# Patient Record
Sex: Male | Born: 1945
Health system: Southern US, Community
[De-identification: ages and names within clinical notes are randomized; demographics above are authoritative.]

---

## 2003-02-06 ENCOUNTER — Encounter: Payer: Self-pay | Admitting: Orthopaedic Surgery

## 2003-02-06 ENCOUNTER — Encounter: Admission: RE | Admit: 2003-02-06 | Discharge: 2003-02-06 | Payer: Self-pay | Admitting: Orthopaedic Surgery

## 2003-04-13 ENCOUNTER — Encounter: Payer: Self-pay | Admitting: Orthopaedic Surgery

## 2003-04-16 ENCOUNTER — Inpatient Hospital Stay (HOSPITAL_COMMUNITY): Admission: RE | Admit: 2003-04-16 | Discharge: 2003-04-19 | Payer: Self-pay | Admitting: Orthopaedic Surgery

## 2003-04-16 ENCOUNTER — Encounter: Payer: Self-pay | Admitting: Orthopaedic Surgery

## 2015-01-06 DIAGNOSIS — Z1389 Encounter for screening for other disorder: Secondary | ICD-10-CM | POA: Diagnosis not present

## 2015-01-06 DIAGNOSIS — J449 Chronic obstructive pulmonary disease, unspecified: Secondary | ICD-10-CM | POA: Diagnosis not present

## 2015-01-19 DIAGNOSIS — J449 Chronic obstructive pulmonary disease, unspecified: Secondary | ICD-10-CM | POA: Diagnosis not present

## 2015-01-19 DIAGNOSIS — F329 Major depressive disorder, single episode, unspecified: Secondary | ICD-10-CM | POA: Diagnosis not present

## 2015-01-19 DIAGNOSIS — J310 Chronic rhinitis: Secondary | ICD-10-CM | POA: Diagnosis not present

## 2015-01-28 DIAGNOSIS — J449 Chronic obstructive pulmonary disease, unspecified: Secondary | ICD-10-CM | POA: Diagnosis not present

## 2015-02-19 DIAGNOSIS — F4322 Adjustment disorder with anxiety: Secondary | ICD-10-CM | POA: Diagnosis not present

## 2015-02-19 DIAGNOSIS — Z9181 History of falling: Secondary | ICD-10-CM | POA: Diagnosis not present

## 2015-02-19 DIAGNOSIS — Z1389 Encounter for screening for other disorder: Secondary | ICD-10-CM | POA: Diagnosis not present

## 2015-05-20 DIAGNOSIS — M25471 Effusion, right ankle: Secondary | ICD-10-CM | POA: Diagnosis not present

## 2015-05-20 DIAGNOSIS — M109 Gout, unspecified: Secondary | ICD-10-CM | POA: Diagnosis not present

## 2015-05-20 DIAGNOSIS — Z6820 Body mass index (BMI) 20.0-20.9, adult: Secondary | ICD-10-CM | POA: Diagnosis not present

## 2015-05-20 DIAGNOSIS — M25579 Pain in unspecified ankle and joints of unspecified foot: Secondary | ICD-10-CM | POA: Diagnosis not present

## 2015-05-21 DIAGNOSIS — Z6820 Body mass index (BMI) 20.0-20.9, adult: Secondary | ICD-10-CM | POA: Diagnosis not present

## 2015-05-21 DIAGNOSIS — F419 Anxiety disorder, unspecified: Secondary | ICD-10-CM | POA: Diagnosis not present

## 2015-05-21 DIAGNOSIS — M1000 Idiopathic gout, unspecified site: Secondary | ICD-10-CM | POA: Diagnosis not present

## 2015-06-10 DIAGNOSIS — M25579 Pain in unspecified ankle and joints of unspecified foot: Secondary | ICD-10-CM | POA: Diagnosis not present

## 2015-06-10 DIAGNOSIS — Z6820 Body mass index (BMI) 20.0-20.9, adult: Secondary | ICD-10-CM | POA: Diagnosis not present

## 2016-09-19 DIAGNOSIS — J449 Chronic obstructive pulmonary disease, unspecified: Secondary | ICD-10-CM | POA: Diagnosis not present

## 2016-09-19 DIAGNOSIS — Z1389 Encounter for screening for other disorder: Secondary | ICD-10-CM | POA: Diagnosis not present

## 2016-09-19 DIAGNOSIS — G8929 Other chronic pain: Secondary | ICD-10-CM | POA: Diagnosis not present

## 2016-09-19 DIAGNOSIS — F419 Anxiety disorder, unspecified: Secondary | ICD-10-CM | POA: Diagnosis not present

## 2016-09-19 DIAGNOSIS — Z6821 Body mass index (BMI) 21.0-21.9, adult: Secondary | ICD-10-CM | POA: Diagnosis not present

## 2016-09-19 DIAGNOSIS — Z23 Encounter for immunization: Secondary | ICD-10-CM | POA: Diagnosis not present

## 2016-09-19 DIAGNOSIS — Z9181 History of falling: Secondary | ICD-10-CM | POA: Diagnosis not present

## 2016-12-21 DIAGNOSIS — F419 Anxiety disorder, unspecified: Secondary | ICD-10-CM | POA: Diagnosis not present

## 2016-12-21 DIAGNOSIS — G8929 Other chronic pain: Secondary | ICD-10-CM | POA: Diagnosis not present

## 2016-12-21 DIAGNOSIS — M109 Gout, unspecified: Secondary | ICD-10-CM | POA: Diagnosis not present

## 2016-12-21 DIAGNOSIS — Z6820 Body mass index (BMI) 20.0-20.9, adult: Secondary | ICD-10-CM | POA: Diagnosis not present

## 2017-02-08 DIAGNOSIS — J449 Chronic obstructive pulmonary disease, unspecified: Secondary | ICD-10-CM | POA: Diagnosis not present

## 2017-02-08 DIAGNOSIS — F419 Anxiety disorder, unspecified: Secondary | ICD-10-CM | POA: Diagnosis not present

## 2017-02-08 DIAGNOSIS — Z720 Tobacco use: Secondary | ICD-10-CM | POA: Diagnosis not present

## 2017-02-08 DIAGNOSIS — Z6829 Body mass index (BMI) 29.0-29.9, adult: Secondary | ICD-10-CM | POA: Diagnosis not present

## 2017-02-08 DIAGNOSIS — G8929 Other chronic pain: Secondary | ICD-10-CM | POA: Diagnosis not present

## 2017-02-19 DIAGNOSIS — F419 Anxiety disorder, unspecified: Secondary | ICD-10-CM | POA: Diagnosis not present

## 2017-02-19 DIAGNOSIS — J449 Chronic obstructive pulmonary disease, unspecified: Secondary | ICD-10-CM | POA: Diagnosis not present

## 2017-02-19 DIAGNOSIS — G8929 Other chronic pain: Secondary | ICD-10-CM | POA: Diagnosis not present

## 2017-02-19 DIAGNOSIS — Z6820 Body mass index (BMI) 20.0-20.9, adult: Secondary | ICD-10-CM | POA: Diagnosis not present

## 2017-02-19 DIAGNOSIS — Z720 Tobacco use: Secondary | ICD-10-CM | POA: Diagnosis not present

## 2017-05-21 DIAGNOSIS — Z139 Encounter for screening, unspecified: Secondary | ICD-10-CM | POA: Diagnosis not present

## 2017-05-21 DIAGNOSIS — J449 Chronic obstructive pulmonary disease, unspecified: Secondary | ICD-10-CM | POA: Diagnosis not present

## 2017-05-21 DIAGNOSIS — Z6820 Body mass index (BMI) 20.0-20.9, adult: Secondary | ICD-10-CM | POA: Diagnosis not present

## 2017-05-21 DIAGNOSIS — G8929 Other chronic pain: Secondary | ICD-10-CM | POA: Diagnosis not present

## 2017-05-21 DIAGNOSIS — M109 Gout, unspecified: Secondary | ICD-10-CM | POA: Diagnosis not present

## 2017-05-21 DIAGNOSIS — Z79899 Other long term (current) drug therapy: Secondary | ICD-10-CM | POA: Diagnosis not present

## 2017-05-21 DIAGNOSIS — F419 Anxiety disorder, unspecified: Secondary | ICD-10-CM | POA: Diagnosis not present

## 2017-05-21 DIAGNOSIS — Z9181 History of falling: Secondary | ICD-10-CM | POA: Diagnosis not present

## 2017-06-22 DIAGNOSIS — Z6820 Body mass index (BMI) 20.0-20.9, adult: Secondary | ICD-10-CM | POA: Diagnosis not present

## 2017-06-22 DIAGNOSIS — G8929 Other chronic pain: Secondary | ICD-10-CM | POA: Diagnosis not present

## 2017-06-22 DIAGNOSIS — F419 Anxiety disorder, unspecified: Secondary | ICD-10-CM | POA: Diagnosis not present

## 2017-08-22 DIAGNOSIS — Z6820 Body mass index (BMI) 20.0-20.9, adult: Secondary | ICD-10-CM | POA: Diagnosis not present

## 2017-08-22 DIAGNOSIS — H5711 Ocular pain, right eye: Secondary | ICD-10-CM | POA: Diagnosis not present

## 2017-08-22 DIAGNOSIS — S0101XA Laceration without foreign body of scalp, initial encounter: Secondary | ICD-10-CM | POA: Diagnosis not present

## 2017-08-22 DIAGNOSIS — Z23 Encounter for immunization: Secondary | ICD-10-CM | POA: Diagnosis not present

## 2017-10-22 DIAGNOSIS — G8929 Other chronic pain: Secondary | ICD-10-CM | POA: Diagnosis not present

## 2017-10-22 DIAGNOSIS — Z6821 Body mass index (BMI) 21.0-21.9, adult: Secondary | ICD-10-CM | POA: Diagnosis not present

## 2017-10-22 DIAGNOSIS — F419 Anxiety disorder, unspecified: Secondary | ICD-10-CM | POA: Diagnosis not present

## 2017-10-22 DIAGNOSIS — Z1211 Encounter for screening for malignant neoplasm of colon: Secondary | ICD-10-CM | POA: Diagnosis not present

## 2017-10-22 DIAGNOSIS — J449 Chronic obstructive pulmonary disease, unspecified: Secondary | ICD-10-CM | POA: Diagnosis not present

## 2017-10-31 DIAGNOSIS — Z1212 Encounter for screening for malignant neoplasm of rectum: Secondary | ICD-10-CM | POA: Diagnosis not present

## 2017-10-31 DIAGNOSIS — Z1211 Encounter for screening for malignant neoplasm of colon: Secondary | ICD-10-CM | POA: Diagnosis not present

## 2018-01-22 DIAGNOSIS — Z1389 Encounter for screening for other disorder: Secondary | ICD-10-CM | POA: Diagnosis not present

## 2018-01-22 DIAGNOSIS — Z6821 Body mass index (BMI) 21.0-21.9, adult: Secondary | ICD-10-CM | POA: Diagnosis not present

## 2018-01-22 DIAGNOSIS — G629 Polyneuropathy, unspecified: Secondary | ICD-10-CM | POA: Diagnosis not present

## 2018-01-22 DIAGNOSIS — F419 Anxiety disorder, unspecified: Secondary | ICD-10-CM | POA: Diagnosis not present

## 2018-01-22 DIAGNOSIS — Z79899 Other long term (current) drug therapy: Secondary | ICD-10-CM | POA: Diagnosis not present

## 2018-01-22 DIAGNOSIS — Z1331 Encounter for screening for depression: Secondary | ICD-10-CM | POA: Diagnosis not present

## 2018-01-22 DIAGNOSIS — M109 Gout, unspecified: Secondary | ICD-10-CM | POA: Diagnosis not present

## 2018-01-22 DIAGNOSIS — J449 Chronic obstructive pulmonary disease, unspecified: Secondary | ICD-10-CM | POA: Diagnosis not present

## 2018-02-04 DIAGNOSIS — G5603 Carpal tunnel syndrome, bilateral upper limbs: Secondary | ICD-10-CM | POA: Diagnosis not present

## 2018-02-20 DIAGNOSIS — G629 Polyneuropathy, unspecified: Secondary | ICD-10-CM | POA: Diagnosis not present

## 2018-02-21 DIAGNOSIS — G5603 Carpal tunnel syndrome, bilateral upper limbs: Secondary | ICD-10-CM | POA: Diagnosis not present

## 2018-02-27 DIAGNOSIS — Z6820 Body mass index (BMI) 20.0-20.9, adult: Secondary | ICD-10-CM | POA: Diagnosis not present

## 2018-02-27 DIAGNOSIS — R0602 Shortness of breath: Secondary | ICD-10-CM | POA: Diagnosis not present

## 2018-02-27 DIAGNOSIS — J449 Chronic obstructive pulmonary disease, unspecified: Secondary | ICD-10-CM | POA: Diagnosis not present

## 2018-02-27 DIAGNOSIS — G5600 Carpal tunnel syndrome, unspecified upper limb: Secondary | ICD-10-CM | POA: Diagnosis not present

## 2018-03-05 DIAGNOSIS — J449 Chronic obstructive pulmonary disease, unspecified: Secondary | ICD-10-CM | POA: Diagnosis not present

## 2018-03-05 DIAGNOSIS — G5602 Carpal tunnel syndrome, left upper limb: Secondary | ICD-10-CM | POA: Diagnosis not present

## 2018-03-05 DIAGNOSIS — F17210 Nicotine dependence, cigarettes, uncomplicated: Secondary | ICD-10-CM | POA: Diagnosis not present

## 2018-03-05 DIAGNOSIS — F17200 Nicotine dependence, unspecified, uncomplicated: Secondary | ICD-10-CM | POA: Diagnosis not present

## 2018-03-05 DIAGNOSIS — Z791 Long term (current) use of non-steroidal anti-inflammatories (NSAID): Secondary | ICD-10-CM | POA: Diagnosis not present

## 2018-03-05 DIAGNOSIS — Z79899 Other long term (current) drug therapy: Secondary | ICD-10-CM | POA: Diagnosis not present

## 2018-03-05 DIAGNOSIS — F418 Other specified anxiety disorders: Secondary | ICD-10-CM | POA: Diagnosis not present

## 2018-03-05 DIAGNOSIS — M1990 Unspecified osteoarthritis, unspecified site: Secondary | ICD-10-CM | POA: Diagnosis not present

## 2018-03-08 DIAGNOSIS — Z4789 Encounter for other orthopedic aftercare: Secondary | ICD-10-CM | POA: Diagnosis not present

## 2018-03-08 DIAGNOSIS — M25532 Pain in left wrist: Secondary | ICD-10-CM | POA: Diagnosis not present

## 2018-03-29 DIAGNOSIS — G5601 Carpal tunnel syndrome, right upper limb: Secondary | ICD-10-CM | POA: Diagnosis not present

## 2018-03-29 DIAGNOSIS — M79641 Pain in right hand: Secondary | ICD-10-CM | POA: Diagnosis not present

## 2018-04-05 DIAGNOSIS — F419 Anxiety disorder, unspecified: Secondary | ICD-10-CM | POA: Diagnosis not present

## 2018-04-05 DIAGNOSIS — L03114 Cellulitis of left upper limb: Secondary | ICD-10-CM | POA: Diagnosis not present

## 2018-04-05 DIAGNOSIS — Z48811 Encounter for surgical aftercare following surgery on the nervous system: Secondary | ICD-10-CM | POA: Diagnosis not present

## 2018-04-05 DIAGNOSIS — M1991 Primary osteoarthritis, unspecified site: Secondary | ICD-10-CM | POA: Diagnosis not present

## 2018-04-05 DIAGNOSIS — F329 Major depressive disorder, single episode, unspecified: Secondary | ICD-10-CM | POA: Diagnosis not present

## 2018-04-05 DIAGNOSIS — G5603 Carpal tunnel syndrome, bilateral upper limbs: Secondary | ICD-10-CM | POA: Diagnosis not present

## 2018-04-05 DIAGNOSIS — Z602 Problems related to living alone: Secondary | ICD-10-CM | POA: Diagnosis not present

## 2018-04-05 DIAGNOSIS — M109 Gout, unspecified: Secondary | ICD-10-CM | POA: Diagnosis not present

## 2018-04-05 DIAGNOSIS — Z9181 History of falling: Secondary | ICD-10-CM | POA: Diagnosis not present

## 2018-04-09 DIAGNOSIS — L03114 Cellulitis of left upper limb: Secondary | ICD-10-CM | POA: Diagnosis not present

## 2018-04-09 DIAGNOSIS — F419 Anxiety disorder, unspecified: Secondary | ICD-10-CM | POA: Diagnosis not present

## 2018-04-09 DIAGNOSIS — G5603 Carpal tunnel syndrome, bilateral upper limbs: Secondary | ICD-10-CM | POA: Diagnosis not present

## 2018-04-09 DIAGNOSIS — M79609 Pain in unspecified limb: Secondary | ICD-10-CM | POA: Diagnosis not present

## 2018-04-09 DIAGNOSIS — Z79899 Other long term (current) drug therapy: Secondary | ICD-10-CM | POA: Diagnosis not present

## 2018-04-09 DIAGNOSIS — Z789 Other specified health status: Secondary | ICD-10-CM | POA: Diagnosis not present

## 2018-04-09 DIAGNOSIS — F418 Other specified anxiety disorders: Secondary | ICD-10-CM | POA: Diagnosis not present

## 2018-04-09 DIAGNOSIS — F1010 Alcohol abuse, uncomplicated: Secondary | ICD-10-CM | POA: Diagnosis not present

## 2018-04-09 DIAGNOSIS — Z9181 History of falling: Secondary | ICD-10-CM | POA: Diagnosis not present

## 2018-04-09 DIAGNOSIS — R296 Repeated falls: Secondary | ICD-10-CM | POA: Diagnosis not present

## 2018-04-09 DIAGNOSIS — M109 Gout, unspecified: Secondary | ICD-10-CM | POA: Diagnosis not present

## 2018-04-09 DIAGNOSIS — F329 Major depressive disorder, single episode, unspecified: Secondary | ICD-10-CM | POA: Diagnosis not present

## 2018-04-09 DIAGNOSIS — M1990 Unspecified osteoarthritis, unspecified site: Secondary | ICD-10-CM | POA: Diagnosis not present

## 2018-04-09 DIAGNOSIS — Z602 Problems related to living alone: Secondary | ICD-10-CM | POA: Diagnosis not present

## 2018-04-09 DIAGNOSIS — G312 Degeneration of nervous system due to alcohol: Secondary | ICD-10-CM | POA: Diagnosis not present

## 2018-04-09 DIAGNOSIS — J449 Chronic obstructive pulmonary disease, unspecified: Secondary | ICD-10-CM | POA: Diagnosis not present

## 2018-04-09 DIAGNOSIS — R42 Dizziness and giddiness: Secondary | ICD-10-CM | POA: Diagnosis not present

## 2018-04-09 DIAGNOSIS — Z48811 Encounter for surgical aftercare following surgery on the nervous system: Secondary | ICD-10-CM | POA: Diagnosis not present

## 2018-04-09 DIAGNOSIS — E871 Hypo-osmolality and hyponatremia: Secondary | ICD-10-CM | POA: Diagnosis not present

## 2018-04-09 DIAGNOSIS — G959 Disease of spinal cord, unspecified: Secondary | ICD-10-CM | POA: Diagnosis not present

## 2018-04-09 DIAGNOSIS — R531 Weakness: Secondary | ICD-10-CM | POA: Diagnosis not present

## 2018-04-09 DIAGNOSIS — M1991 Primary osteoarthritis, unspecified site: Secondary | ICD-10-CM | POA: Diagnosis not present

## 2018-04-09 DIAGNOSIS — R270 Ataxia, unspecified: Secondary | ICD-10-CM | POA: Diagnosis not present

## 2018-04-10 ENCOUNTER — Inpatient Hospital Stay (HOSPITAL_COMMUNITY)
Admission: AD | Admit: 2018-04-10 | Discharge: 2018-04-15 | DRG: 551 | Disposition: A | Discharge status: Home Health | Payer: Medicare HMO | Source: Other Acute Inpatient Hospital | Attending: Internal Medicine | Admitting: Internal Medicine

## 2018-04-10 DIAGNOSIS — D649 Anemia, unspecified: Secondary | ICD-10-CM | POA: Diagnosis present

## 2018-04-10 DIAGNOSIS — Q7649 Other congenital malformations of spine, not associated with scoliosis: Secondary | ICD-10-CM | POA: Diagnosis not present

## 2018-04-10 DIAGNOSIS — F418 Other specified anxiety disorders: Secondary | ICD-10-CM | POA: Diagnosis not present

## 2018-04-10 DIAGNOSIS — J449 Chronic obstructive pulmonary disease, unspecified: Secondary | ICD-10-CM | POA: Diagnosis not present

## 2018-04-10 DIAGNOSIS — Z79899 Other long term (current) drug therapy: Secondary | ICD-10-CM | POA: Diagnosis not present

## 2018-04-10 DIAGNOSIS — G259 Extrapyramidal and movement disorder, unspecified: Secondary | ICD-10-CM | POA: Diagnosis not present

## 2018-04-10 DIAGNOSIS — G9519 Other vascular myelopathies: Secondary | ICD-10-CM | POA: Diagnosis not present

## 2018-04-10 DIAGNOSIS — F17200 Nicotine dependence, unspecified, uncomplicated: Secondary | ICD-10-CM | POA: Diagnosis present

## 2018-04-10 DIAGNOSIS — R531 Weakness: Secondary | ICD-10-CM

## 2018-04-10 DIAGNOSIS — E860 Dehydration: Secondary | ICD-10-CM | POA: Diagnosis not present

## 2018-04-10 DIAGNOSIS — E871 Hypo-osmolality and hyponatremia: Secondary | ICD-10-CM | POA: Diagnosis present

## 2018-04-10 DIAGNOSIS — M50222 Other cervical disc displacement at C5-C6 level: Secondary | ICD-10-CM | POA: Diagnosis not present

## 2018-04-10 DIAGNOSIS — F419 Anxiety disorder, unspecified: Secondary | ICD-10-CM | POA: Diagnosis not present

## 2018-04-10 DIAGNOSIS — G959 Disease of spinal cord, unspecified: Secondary | ICD-10-CM | POA: Diagnosis not present

## 2018-04-10 DIAGNOSIS — R270 Ataxia, unspecified: Secondary | ICD-10-CM | POA: Diagnosis not present

## 2018-04-10 DIAGNOSIS — M4802 Spinal stenosis, cervical region: Principal | ICD-10-CM | POA: Diagnosis present

## 2018-04-10 DIAGNOSIS — Z789 Other specified health status: Secondary | ICD-10-CM | POA: Diagnosis not present

## 2018-04-10 DIAGNOSIS — F1010 Alcohol abuse, uncomplicated: Secondary | ICD-10-CM | POA: Diagnosis not present

## 2018-04-10 DIAGNOSIS — M1990 Unspecified osteoarthritis, unspecified site: Secondary | ICD-10-CM | POA: Diagnosis not present

## 2018-04-10 DIAGNOSIS — Q069 Congenital malformation of spinal cord, unspecified: Secondary | ICD-10-CM

## 2018-04-10 LAB — CBC WITH DIFFERENTIAL/PLATELET
ABS IMMATURE GRANULOCYTES: 0 10*3/uL (ref 0.0–0.1)
BASOS PCT: 1 %
Basophils Absolute: 0.1 10*3/uL (ref 0.0–0.1)
EOS PCT: 3 %
Eosinophils Absolute: 0.2 10*3/uL (ref 0.0–0.7)
HCT: 36.8 % — ABNORMAL LOW (ref 39.0–52.0)
HEMOGLOBIN: 12.4 g/dL — AB (ref 13.0–17.0)
Immature Granulocytes: 0 %
Lymphocytes Relative: 40 %
Lymphs Abs: 2 10*3/uL (ref 0.7–4.0)
MCH: 32.9 pg (ref 26.0–34.0)
MCHC: 33.7 g/dL (ref 30.0–36.0)
MCV: 97.6 fL (ref 78.0–100.0)
MONO ABS: 0.7 10*3/uL (ref 0.1–1.0)
MONOS PCT: 15 %
NEUTROS ABS: 2.1 10*3/uL (ref 1.7–7.7)
Neutrophils Relative %: 41 %
PLATELETS: 345 10*3/uL (ref 150–400)
RBC: 3.77 MIL/uL — ABNORMAL LOW (ref 4.22–5.81)
RDW: 12 % (ref 11.5–15.5)
WBC: 5.1 10*3/uL (ref 4.0–10.5)

## 2018-04-10 LAB — COMPREHENSIVE METABOLIC PANEL
ALBUMIN: 3.5 g/dL (ref 3.5–5.0)
ALT: 16 U/L — AB (ref 17–63)
AST: 24 U/L (ref 15–41)
Alkaline Phosphatase: 55 U/L (ref 38–126)
Anion gap: 8 (ref 5–15)
BILIRUBIN TOTAL: 0.5 mg/dL (ref 0.3–1.2)
BUN: 8 mg/dL (ref 6–20)
CO2: 21 mmol/L — ABNORMAL LOW (ref 22–32)
Calcium: 8.8 mg/dL — ABNORMAL LOW (ref 8.9–10.3)
Chloride: 103 mmol/L (ref 101–111)
Creatinine, Ser: 0.55 mg/dL — ABNORMAL LOW (ref 0.61–1.24)
GFR calc Af Amer: >60 mL/min (ref >60)
GLUCOSE: 94 mg/dL (ref 65–99)
POTASSIUM: 4.1 mmol/L (ref 3.5–5.1)
Sodium: 132 mmol/L — ABNORMAL LOW (ref 135–145)
TOTAL PROTEIN: 6.5 g/dL (ref 6.5–8.1)

## 2018-04-10 LAB — MAGNESIUM: Magnesium: 1.7 mg/dL (ref 1.7–2.4)

## 2018-04-10 MED ORDER — SODIUM CHLORIDE 0.9% FLUSH
3.0000 mL | INTRAVENOUS | Status: DC | PRN
Start: 2018-04-10 — End: 2018-04-15

## 2018-04-10 MED ORDER — VITAMIN B-1 100 MG PO TABS
100.0000 mg | ORAL_TABLET | Freq: Every day | ORAL | Status: DC
  Administered 2018-04-11 – 2018-04-15 (×5): 100 mg via ORAL
  Filled 2018-04-10 (×6): qty 1

## 2018-04-10 MED ORDER — LORAZEPAM 1 MG PO TABS
1.0000 mg | ORAL_TABLET | Freq: Four times a day (QID) | ORAL | Status: AC | PRN
  Administered 2018-04-12: 1 mg via ORAL
  Filled 2018-04-10: qty 1

## 2018-04-10 MED ORDER — SODIUM CHLORIDE 0.9 % IV SOLN: 250.0000 mL | INTRAVENOUS | Status: DC | PRN

## 2018-04-10 MED ORDER — FOLIC ACID 1 MG PO TABS
1.0000 mg | ORAL_TABLET | Freq: Every day | ORAL | Status: DC
  Administered 2018-04-10 – 2018-04-15 (×6): 1 mg via ORAL
  Filled 2018-04-10 (×7): qty 1

## 2018-04-10 MED ORDER — SODIUM CHLORIDE 0.9% FLUSH
3.0000 mL | Freq: Two times a day (BID) | INTRAVENOUS | Status: DC
  Administered 2018-04-10 – 2018-04-14 (×7): 3 mL via INTRAVENOUS

## 2018-04-10 MED ORDER — LORAZEPAM 2 MG/ML IJ SOLN
1.0000 mg | Freq: Four times a day (QID) | INTRAMUSCULAR | Status: AC | PRN
  Administered 2018-04-11 (×2): 1 mg via INTRAVENOUS
  Filled 2018-04-10 (×2): qty 1

## 2018-04-10 MED ORDER — ADULT MULTIVITAMIN W/MINERALS CH
1.0000 | ORAL_TABLET | Freq: Every day | ORAL | Status: DC
  Administered 2018-04-10 – 2018-04-15 (×6): 1 via ORAL
  Filled 2018-04-10 (×6): qty 1

## 2018-04-10 MED ORDER — THIAMINE HCL 100 MG/ML IJ SOLN
100.0000 mg | Freq: Every day | INTRAMUSCULAR | Status: DC
  Administered 2018-04-10: 100 mg via INTRAVENOUS
  Filled 2018-04-10: qty 2

## 2018-04-10 NOTE — Consult Note (Signed)
Reason for Consult:cervical stenosis, cervical myelopathy Referring Physician: Onalee Huadavid, r a  Roger RilingJames Sanders is an 72 y.o. male.  HPI: whom was [facility] for severe gait difficulties. He reportedly had carpal tunnel release surgery ~two weeks ago. He has had weakness in the left upper extremity since that time. He states when he tried to stand two days ago all he did was shake. He admits to drinking heavily since 10/2016 when his wife passed, and does drink daily. MRI revealed spinal stenosis at C 3/4. There appears to be calcification of the ligamentum flavum at this level causing the stenosis. He also admits to falling and striking his head approximately two weeks ago. He states however that his problems walking were not present until Sunday. He denies pain, nor change in his bowel and bladder function.   No past medical history on file.   No family history on file.  Social History:  has no tobacco, alcohol, and drug history on file.  Allergies: No Known Allergies  Medications: I have reviewed the patient's current medications.  No results found for this or any previous visit (from the past 48 hour(s)).  No results found.  Review of Systems  Genitourinary: Negative.   Neurological: Positive for tremors and focal weakness.       DIFFICULTY WALKING  Endo/Heme/Allergies: Negative.    Blood pressure 121/63, pulse 76, temperature 98.2 F (36.8 C), temperature source Oral, resp. rate 18, SpO2 98 %. Physical Exam  Constitutional: He is oriented to person, place, and time.  HENT:  Head: Normocephalic and atraumatic.  Eyes: Pupils are equal, round, and reactive to light. Conjunctivae and EOM are normal.  Neck: Normal range of motion.  Neurological: He is alert and oriented to person, place, and time. He has normal reflexes. He displays tremor. No cranial nerve deficit. He exhibits normal muscle tone. Coordination abnormal.  Has an ulnar claw hand on the left Weak right and  left grips 4/5 Right upper extremity otherwise strong.  Weakness in left deltoid, biceps, triceps, intrinsics 4/5 Movements are saccadic in his extremities, not smooth at all Proprioception intact Could not assess gait  Skin: Skin is warm and dry.  Psychiatric:  depressed    Assessment/Plan: Roger RilingJames Sanders is a 72 y.o. male Whom presents with Lue weakness, ballistic type movements, etoh abuse, and cervical stenosis.  Would like a better study since other mri has too much movement Not clear why acute onset of gait problems, calcified ligament would not cause this.  I have placed him on a withdrawal protocol.  Will follow.   Yesmin Mutch L 04/10/2018, 7:42 PM

## 2018-04-10 NOTE — H&P (Signed)
History and Physical    Roger Sanders ZOX:[id number] DOB: 08/20/1946 DOA: 04/10/2018  PCP: Default, Provider, MD  Patient [facility]  Chief Complaint: Generalized weakness  HPI: Roger Sanders is a 72 y.o. male with medical history significant of recent carpal tunnel surgery, previous back surgery transferred [facility] for neurosurgical evaluation.  Patient presented there with generalized weakness without any focal neurological deficits and had a stroke work-up.  He had a MRI of his C spine which showed C3-C4 possible  mass-effect on cord and swelling.  Patient denies any neck pain.  He denies any fevers.  He did have a recent fall but denies any real neck injury.  He denies any upper extremity weakness.  He denies any new urinary symptoms or any saddle anesthesia.  He does have some pain around the incision from his previous carpal tunnel surgery but no other neurological deficits.  Dr. Franky Machoabbell with neurosurgery was called who recommended patient being transferred here for neurosurgical evaluation.  Review of Systems: As per HPI otherwise 10 point review of systems negative.   History reviewed. No pertinent past medical history.  Carpal tunnel surgery, previous lumbar back surgery  History reviewed. No pertinent surgical history.   reports that he has been smoking.  He has never used smokeless tobacco. He reports that he drinks alcohol. His drug history is not on file.  No Known Allergies  History reviewed. No pertinent family history.  No premature coronary artery disease  Prior to Admission medications   Not on File  Reviewed and pending  Physical Exam: Vitals:   04/10/18 1700 04/10/18 2058 04/10/18 2349 04/11/18 0431  BP: 121/63 (!) 119/44 128/61 115/68  Pulse: 76 76 68 72  Resp: 18 18 15 14   Temp: 98.2 F (36.8 C) 98.2 F (36.8 C) 98.2 F (36.8 C) 98.2 F (36.8 C)  TempSrc: Oral Oral Oral Oral  SpO2: 98% 98% 97% 99%      Constitutional:  NAD, calm, comfortable Vitals:   04/10/18 1700 04/10/18 2058 04/10/18 2349 04/11/18 0431  BP: 121/63 (!) 119/44 128/61 115/68  Pulse: 76 76 68 72  Resp: 18 18 15 14   Temp: 98.2 F (36.8 C) 98.2 F (36.8 C) 98.2 F (36.8 C) 98.2 F (36.8 C)  TempSrc: Oral Oral Oral Oral  SpO2: 98% 98% 97% 99%   Eyes: PERRL, lids and conjunctivae normal ENMT: Mucous membranes are moist. Posterior pharynx clear of any exudate or lesions.Normal dentition.  Neck: normal, supple, no masses, no thyromegaly Respiratory: clear to auscultation bilaterally, no wheezing, no crackles. Normal respiratory effort. No accessory muscle use.  Cardiovascular: Regular rate and rhythm, no murmurs / rubs / gallops. No extremity edema. 2+ pedal pulses. No carotid bruits.  Abdomen: no tenderness, no masses palpated. No hepatosplenomegaly. Bowel sounds positive.  Musculoskeletal: no clubbing / cyanosis. No joint deformity upper and lower extremities. Good ROM, no contractures. Normal muscle tone.  Skin: no rashes, lesions, ulcers. No induration Neurologic: CN 2-12 grossly intact. Sensation intact, DTR normal. Strength 5/5 in all 4.  Psychiatric: Normal judgment and insight. Alert and oriented x 3. Normal mood.    Labs on Admission: I have personally reviewed following labs and imaging studies  CBC: Recent Labs  Lab 04/10/18 1919  WBC 5.1  NEUTROABS 2.1  HGB 12.4*  HCT 36.8*  MCV 97.6  PLT 345   Basic Metabolic Panel: Recent Labs  Lab 04/10/18 1919  NA 132*  K 4.1  CL 103  CO2 21*  GLUCOSE 94  BUN 8  CREATININE 0.55*  CALCIUM 8.8*  MG 1.7   GFR: CrCl cannot be calculated (Unknown ideal weight.). Liver Function Tests: Recent Labs  Lab 04/10/18 1919  AST 24  ALT 16*  ALKPHOS 55  BILITOT 0.5  PROT 6.5  ALBUMIN 3.5   No results for input(s): LIPASE, AMYLASE in the last 168 hours. No results for input(s): AMMONIA in the last 168 hours. Coagulation Profile: No results for input(s): INR, PROTIME in  the last 168 hours. Cardiac Enzymes: No results for input(s): CKTOTAL, CKMB, CKMBINDEX, TROPONINI in the last 168 hours. BNP (last 3 results) No results for input(s): PROBNP in the last 8760 hours. HbA1C: No results for input(s): HGBA1C in the last 72 hours. CBG: No results for input(s): GLUCAP in the last 168 hours. Lipid Profile: No results for input(s): CHOL, HDL, LDLCALC, TRIG, CHOLHDL, LDLDIRECT in the last 72 hours. Thyroid Function Tests: No results for input(s): TSH, T4TOTAL, FREET4, T3FREE, THYROIDAB in the last 72 hours. Anemia Panel: No results for input(s): VITAMINB12, FOLATE, FERRITIN, TIBC, IRON, RETICCTPCT in the last 72 hours. Urine analysis: No results found for: COLORURINE, APPEARANCEUR, LABSPEC, PHURINE, GLUCOSEU, HGBUR, BILIRUBINUR, KETONESUR, PROTEINUR, UROBILINOGEN, NITRITE, LEUKOCYTESUR Sepsis Labs: !!!!!!!!!!!!!!!!!!!!!!!!!!!!!!!!!!!!!!!!!!!! @LABRCNTIP (procalcitonin:4,lacticidven:4) )No results found for this or any previous visit (from the past 240 hour(s)).   Radiological Exams on Admission: No results found.   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  No neck pain.  Neurosurgical evaluation pending Dr. Franky Macho.  Active Problems:   Generalized weakness-physical therapy evaluation         DVT prophylaxis: SCDs Code Status: Full Family Communication: None Disposition Plan: Per day team Consults called: Neurosurgery Admission status: Observation   Raynold Blankenbaker A MD [facility]  If 7PM-7AM, please contact night-coverage [email or web address] Password [facility]  04/11/2018, 6:43 AM

## 2018-04-11 ENCOUNTER — Observation Stay (HOSPITAL_COMMUNITY): Payer: Medicare HMO

## 2018-04-11 ENCOUNTER — Encounter (HOSPITAL_COMMUNITY): Payer: Self-pay | Admitting: General Practice

## 2018-04-11 ENCOUNTER — Other Ambulatory Visit: Payer: Self-pay

## 2018-04-11 DIAGNOSIS — D649 Anemia, unspecified: Secondary | ICD-10-CM | POA: Diagnosis present

## 2018-04-11 DIAGNOSIS — G9519 Other vascular myelopathies: Secondary | ICD-10-CM | POA: Diagnosis present

## 2018-04-11 DIAGNOSIS — M4802 Spinal stenosis, cervical region: Secondary | ICD-10-CM | POA: Diagnosis present

## 2018-04-11 DIAGNOSIS — Q7649 Other congenital malformations of spine, not associated with scoliosis: Secondary | ICD-10-CM | POA: Diagnosis not present

## 2018-04-11 DIAGNOSIS — G259 Extrapyramidal and movement disorder, unspecified: Secondary | ICD-10-CM | POA: Diagnosis not present

## 2018-04-11 DIAGNOSIS — F17200 Nicotine dependence, unspecified, uncomplicated: Secondary | ICD-10-CM | POA: Diagnosis present

## 2018-04-11 DIAGNOSIS — E860 Dehydration: Secondary | ICD-10-CM | POA: Diagnosis present

## 2018-04-11 DIAGNOSIS — F419 Anxiety disorder, unspecified: Secondary | ICD-10-CM | POA: Diagnosis present

## 2018-04-11 DIAGNOSIS — E871 Hypo-osmolality and hyponatremia: Secondary | ICD-10-CM | POA: Diagnosis present

## 2018-04-11 DIAGNOSIS — R531 Weakness: Secondary | ICD-10-CM | POA: Diagnosis present

## 2018-04-11 LAB — BASIC METABOLIC PANEL
Anion gap: 8 (ref 5–15)
BUN: 7 mg/dL (ref 6–20)
CALCIUM: 9 mg/dL (ref 8.9–10.3)
CHLORIDE: 99 mmol/L — AB (ref 101–111)
CO2: 23 mmol/L (ref 22–32)
CREATININE: 0.56 mg/dL — AB (ref 0.61–1.24)
GFR calc non Af Amer: >60 mL/min (ref >60)
GLUCOSE: 94 mg/dL (ref 65–99)
Potassium: 4.3 mmol/L (ref 3.5–5.1)
Sodium: 130 mmol/L — ABNORMAL LOW (ref 135–145)

## 2018-04-11 NOTE — Progress Notes (Signed)
Physical Therapy Evaluation Patient Details Name: Roger Sanders MRN: [id number] DOB: August 04, 1946 Today's Date: 04/11/2018   History of Present Illness  Patient is a 72 y/o male admitted on 04/10/18 for neurosurgical evaluation due to general weakness, tremors, and gait difficulties. MRI revealing spinal stenosis at C3-4. Patient undergoing continued work-up. PMH significant for EOTH abuse, recent carpal tunnel surgery, prior back surgeries.   Clinical Impression  Patient admitted with the above listed diagnosis. Prior to admission patient reports ability to perform functional mobility and ADLs with/without SPC. Today able to perform bed mobility with Min guard and transfers at Min A for overall stability and balance. Does demonstrate tremor upon initial sitting and standing positions, however nearly resolves. Able to side step at EOB and march in place with no LOB, however close guarding for safety. PT to continue to follow acutely to maximize functional mobility and safety.     Follow Up Recommendations Supervision for mobility/OOB(will continue to assess depending on hospital course)    Equipment Recommendations  Rolling walker with 5" wheels    Recommendations for Other Services OT consult     Precautions / Restrictions Precautions Precautions: Fall Restrictions Weight Bearing Restrictions: No      Mobility  Bed Mobility Overal bed mobility: Needs Assistance Bed Mobility: Supine to Sit;Sit to Supine     Supine to sit: Min guard Sit to supine: Min guard   General bed mobility comments: intermittent VC for sequencing for overall efficiency of task  Transfers Overall transfer level: Needs assistance Equipment used: Rolling walker (2 wheeled) Transfers: Sit to/from Stand Sit to Stand: Min assist;Min guard         General transfer comment: patient very shaking during transfer, hesitant to transfer due to fear of falling  Ambulation/Gait Ambulation/Gait assistance: Min  assist;Min guard   Assistive device: Rolling walker (2 wheeled)       General Gait Details: side stepping R and L at EOB [employer] for steadying; may require increased external support for forward ambulation  Stairs            Wheelchair Mobility    Modified Rankin (Stroke Patients Only) Modified Rankin (Stroke Patients Only) Pre-Morbid Rankin Score: No significant disability Modified Rankin: Moderately severe disability     Balance Overall balance assessment: Needs assistance Sitting-balance support: Single extremity supported;Feet supported Sitting balance-Leahy Scale: Fair     Standing balance support: Bilateral upper extremity supported Standing balance-Leahy Scale: Poor Standing balance comment: reliant on RW + close guarding for stability and safety                             Pertinent Vitals/Pain Pain Assessment: No/denies pain    Home Living Family/patient expects to be discharged to:: Private residence Living Arrangements: Alone   Type of Home: House Home Access: Ramped entrance     Home Layout: One level Home Equipment: Cane - single point Additional Comments: reports ability to perform ADLs and light housework prior to admission    Prior Function Level of Independence: Independent;Independent with assistive device(s)         Comments: reports intermittent sue of SPC     Hand Dominance        Extremity/Trunk Assessment   Upper Extremity Assessment Upper Extremity Assessment: Defer to OT evaluation    Lower Extremity Assessment Lower Extremity Assessment: Generalized weakness       Communication   Communication: No difficulties  Cognition Arousal/Alertness: Awake/alert Behavior  During Therapy: WFL for tasks assessed/performed Overall Cognitive Status: No family/caregiver present to determine baseline cognitive functioning                                 General Comments: often times would stare at  computer while sitting EOB requiring redirection to attend to tasks      General Comments      Exercises     Assessment/Plan    PT Assessment Patient needs continued PT services  PT Problem List Decreased strength;Decreased activity tolerance;Decreased balance;Decreased mobility;Decreased coordination;Decreased knowledge of use of DME;Decreased safety awareness       PT Treatment Interventions DME instruction;Gait training;Functional mobility training;Therapeutic activities;Therapeutic exercise;Balance training;Patient/family education    PT Goals (Current goals can be found in the Care Plan section)  Acute Rehab PT Goals Patient Stated Goal: get back to normal PT Goal Formulation: With patient Time For Goal Achievement: 04/25/18 Potential to Achieve Goals: Good    Frequency Min 4X/week   Barriers to discharge        Co-evaluation               AM-PAC PT "6 Clicks" Daily Activity  Outcome Measure Difficulty turning over in bed (including adjusting bedclothes, sheets and blankets)?: A Little Difficulty moving from lying on back to sitting on the side of the bed? : Unable Difficulty sitting down on and standing up from a chair with arms (e.g., wheelchair, bedside commode, etc,.)?: Unable Help needed moving to and from a bed to chair (including a wheelchair)?: A Little Help needed walking in hospital room?: A Little Help needed climbing 3-5 steps with a railing? : A Lot 6 Click Score: 13    End of Session Equipment Utilized During Treatment: Gait belt Activity Tolerance: Patient tolerated treatment well Patient left: in bed;with call bell/phone within reach;with bed alarm set Nurse Communication: Mobility status PT Visit Diagnosis: Unsteadiness on feet (R26.81);Other abnormalities of gait and mobility (R26.89);Difficulty in walking, not elsewhere classified (R26.2);Muscle weakness (generalized) (M62.81)    Time: [id number] PT Time Calculation (min) (ACUTE ONLY):  21 min   Charges:   PT Evaluation $PT Eval Moderate Complexity: 1 Mod     PT G Codes:         Kipp LaurenceStephanie R Aaron, PT, DPT 04/11/18 2:27 PM

## 2018-04-11 NOTE — Progress Notes (Signed)
PROGRESS NOTE    Roger Sanders  ZOX:[id number] DOB: 1946/05/24 DOA: 04/10/2018 PCP: Default, Provider, MD    Brief Narrative:  Roger Sanders is a 72 y.o. male with medical history significant of recent carpal tunnel surgery, previous back surgery transferred here from [facility] for neurosurgical evaluation.  Patient presented there with generalized weakness without any focal neurological deficits and had a stroke work-up.  He had a MRI of his C spine which showed C3-C4 possible  mass-effect on cord and swelling.  Dr. Franky Macho with neurosurgery was called who recommended patient being transferred here for neurosurgical evaluation. A repeat MRi of the cervical spine ordered.       Assessment & Plan:   Principal Problem:   Spine anomaly Active Problems:   Generalized weakness   Spinal cord anomaly (HCC)   Severe spinal canal stenosis at C3 AND C4: Further management asper neuro surgery .  Pain control and therapy eval for now.    Mild normocytic anemia:  Hemoglobin stable around 12.    Hyponatremia:  Unclear etiology.  Suspect some dehydration. Will start him on gentle hydration.    DVT prophylaxis: (SCD'S Code Status: full code.  Family Communication: none at bedside Disposition Plan: pending evaluation by neuro surgery.    Consultants:   Neuro surgery.   Procedures:MRI cervical spine.   Antimicrobials:none.   Subjective: Anxious but denies any complaints.   Objective: Vitals:   04/11/18 0431 04/11/18 0754 04/11/18 1156 04/11/18 1543  BP: 115/68 127/65 126/74 122/64  Pulse: 72 66 76 74  Resp: 14 14 16 18   Temp: 98.2 F (36.8 C) 98 F (36.7 C) 98.1 F (36.7 C) 98.1 F (36.7 C)  TempSrc: Oral Oral Oral Oral  SpO2: 99% 98% 98% 100%    Intake/Output Summary (Last 24 hours) at 04/11/2018 1754 Last data filed at 04/11/2018 1659 Gross per 24 hour  Intake 363 ml  Output 750 ml  Net -387 ml   There were no vitals filed for this  visit.  Examination:  General exam: Appears calm and comfortable  Respiratory system: Clear to auscultation. Respiratory effort normal. Cardiovascular system: S1 & S2 heard, RRR. No JVD, murmurs,. No pedal edema. Gastrointestinal system: Abdomen is nondistended, soft and nontender. No organomegaly or masses felt. Normal bowel sounds heard. Central nervous system: Alert and oriented. No focal neurological deficits. Extremities: no pedal edema, cyanosis.  Skin: No rashes, lesions or ulcers Psychiatry:Mood & affect appropriate.     Data Reviewed: I have personally reviewed following labs and imaging studies  CBC: Recent Labs  Lab 04/10/18 1919  WBC 5.1  NEUTROABS 2.1  HGB 12.4*  HCT 36.8*  MCV 97.6  PLT 345   Basic Metabolic Panel: Recent Labs  Lab 04/10/18 1919 04/11/18 0744  NA 132* 130*  K 4.1 4.3  CL 103 99*  CO2 21* 23  GLUCOSE 94 94  BUN 8 7  CREATININE 0.55* 0.56*  CALCIUM 8.8* 9.0  MG 1.7  --    GFR: CrCl cannot be calculated (Unknown ideal weight.). Liver Function Tests: Recent Labs  Lab 04/10/18 1919  AST 24  ALT 16*  ALKPHOS 55  BILITOT 0.5  PROT 6.5  ALBUMIN 3.5   No results for input(s): LIPASE, AMYLASE in the last 168 hours. No results for input(s): AMMONIA in the last 168 hours. Coagulation Profile: No results for input(s): INR, PROTIME in the last 168 hours. Cardiac Enzymes: No results for input(s): CKTOTAL, CKMB, CKMBINDEX, TROPONINI in the last 168 hours. BNP (last  3 results) No results for input(s): PROBNP in the last 8760 hours. HbA1C: No results for input(s): HGBA1C in the last 72 hours. CBG: No results for input(s): GLUCAP in the last 168 hours. Lipid Profile: No results for input(s): CHOL, HDL, LDLCALC, TRIG, CHOLHDL, LDLDIRECT in the last 72 hours. Thyroid Function Tests: No results for input(s): TSH, T4TOTAL, FREET4, T3FREE, THYROIDAB in the last 72 hours. Anemia Panel: No results for input(s): VITAMINB12, FOLATE,  FERRITIN, TIBC, IRON, RETICCTPCT in the last 72 hours. Sepsis Labs: No results for input(s): PROCALCITON, LATICACIDVEN in the last 168 hours.  No results found for this or any previous visit (from the past 240 hour(s)).       Radiology Studies: Mr Brain Wo Contrast  Result Date: 04/11/2018 CLINICAL DATA:  Unable to ambulate. Movement disorder. History of spinal cord anomaly, falls with alcoholism. Assess cervical spine stenosis. EXAM: MRI HEAD WITHOUT CONTRAST MRI CERVICAL SPINE WITHOUT CONTRAST TECHNIQUE: Multiplanar, multiecho pulse sequences of the brain and surrounding structures, and cervical spine, to include the craniocervical junction and cervicothoracic junction, were obtained without intravenous contrast. COMPARISON:  MRI head and cervical spine April 10, 2018. FINDINGS: Moderate motion degraded examination, worse than yesterday's examination. MRI HEAD FINDINGS INTRACRANIAL CONTENTS: No reduced diffusion to suggest acute ischemia. No susceptibility artifact to suggest hemorrhage. The ventricles and sulci are normal for patient's age. A few scattered subcentimeter supratentorial white matter FLAIR T2 hyperintensities compatible with mild chronic small vessel ischemic changes. No suspicious parenchymal signal, masses, mass effect. No abnormal extra-axial fluid collections. No extra-axial masses. VASCULAR: Normal major intracranial vascular flow voids present at skull base. SKULL AND UPPER CERVICAL SPINE: No abnormal sellar expansion. No suspicious calvarial bone marrow signal. Craniocervical junction maintained. SINUSES/ORBITS: Trace paranasal sinus mucosal thickening.The included ocular globes and orbital contents are non-suspicious. OTHER: Patient is edentulous. Moderate to severely motion degraded examination, worse than yesterday's examination. MRI CERVICAL SPINE FINDINGS ALIGNMENT: Maintained cervical lordosis. Minimal C3-4 retrolisthesis. VERTEBRAE/DISCS: Vertebral bodies are intact.  Intervertebral disc morphology's maintained with mild desiccation. Multilevel mild chronic discogenic endplate changes are without convincing evidence of acute or abnormal bone marrow signal abnormality. CORD:Cervical spinal cord compression at C3-4 with mild by a cord edema measuring approximately 1 cm. No syrinx or myelomalacia. POSTERIOR FOSSA, VERTEBRAL ARTERIES, PARASPINAL TISSUES: Limited assessment for ligamentous injury due to motion without definite paraspinal or ligamentous STIR signal abnormality. Vertebral artery flow voids generally maintained. DISC LEVELS: Severe canal stenosis at C3-4, 5 mm in AP dimension. No additional levels of severe canal stenosis. Severe suspected bilateral C3-4 neural foraminal narrowing. IMPRESSION: MRI head: 1. Moderately motion degraded examination, worse than yesterday's MRI head. 2. No acute intracranial process; negative noncontrast MRI head. MRI cervical spine: 1. Moderate to severely motion degraded examination, worse than yesterday's MRI cervical spine. 2. Severe canal stenosis C3-4 with focal cord edema. No syrinx. Severe suspected bilateral C3-4 neural foraminal narrowing. 3. Given inability to tolerate MRI, CT cervical spine may be of added value or, re-attempt MRI with sedation. Electronically Signed   By: Awilda Metroourtnay  Bloomer M.D.   On: 04/11/2018 15:20   Mr Cervical Spine Wo Contrast  Result Date: 04/11/2018 CLINICAL DATA:  Unable to ambulate. Movement disorder. History of spinal cord anomaly, falls with alcoholism. Assess cervical spine stenosis. EXAM: MRI HEAD WITHOUT CONTRAST MRI CERVICAL SPINE WITHOUT CONTRAST TECHNIQUE: Multiplanar, multiecho pulse sequences of the brain and surrounding structures, and cervical spine, to include the craniocervical junction and cervicothoracic junction, were obtained without intravenous contrast. COMPARISON:  MRI head  and cervical spine April 10, 2018. FINDINGS: Moderate motion degraded examination, worse than yesterday's  examination. MRI HEAD FINDINGS INTRACRANIAL CONTENTS: No reduced diffusion to suggest acute ischemia. No susceptibility artifact to suggest hemorrhage. The ventricles and sulci are normal for patient's age. A few scattered subcentimeter supratentorial white matter FLAIR T2 hyperintensities compatible with mild chronic small vessel ischemic changes. No suspicious parenchymal signal, masses, mass effect. No abnormal extra-axial fluid collections. No extra-axial masses. VASCULAR: Normal major intracranial vascular flow voids present at skull base. SKULL AND UPPER CERVICAL SPINE: No abnormal sellar expansion. No suspicious calvarial bone marrow signal. Craniocervical junction maintained. SINUSES/ORBITS: Trace paranasal sinus mucosal thickening.The included ocular globes and orbital contents are non-suspicious. OTHER: Patient is edentulous. Moderate to severely motion degraded examination, worse than yesterday's examination. MRI CERVICAL SPINE FINDINGS ALIGNMENT: Maintained cervical lordosis. Minimal C3-4 retrolisthesis. VERTEBRAE/DISCS: Vertebral bodies are intact. Intervertebral disc morphology's maintained with mild desiccation. Multilevel mild chronic discogenic endplate changes are without convincing evidence of acute or abnormal bone marrow signal abnormality. CORD:Cervical spinal cord compression at C3-4 with mild by a cord edema measuring approximately 1 cm. No syrinx or myelomalacia. POSTERIOR FOSSA, VERTEBRAL ARTERIES, PARASPINAL TISSUES: Limited assessment for ligamentous injury due to motion without definite paraspinal or ligamentous STIR signal abnormality. Vertebral artery flow voids generally maintained. DISC LEVELS: Severe canal stenosis at C3-4, 5 mm in AP dimension. No additional levels of severe canal stenosis. Severe suspected bilateral C3-4 neural foraminal narrowing. IMPRESSION: MRI head: 1. Moderately motion degraded examination, worse than yesterday's MRI head. 2. No acute intracranial process;  negative noncontrast MRI head. MRI cervical spine: 1. Moderate to severely motion degraded examination, worse than yesterday's MRI cervical spine. 2. Severe canal stenosis C3-4 with focal cord edema. No syrinx. Severe suspected bilateral C3-4 neural foraminal narrowing. 3. Given inability to tolerate MRI, CT cervical spine may be of added value or, re-attempt MRI with sedation. Electronically Signed   By: Awilda Metro M.D.   On: 04/11/2018 15:20        Scheduled Meds: . folic acid  1 mg Oral Daily  . multivitamin with minerals  1 tablet Oral Daily  . sodium chloride flush  3 mL Intravenous Q12H  . thiamine  100 mg Oral Daily   Or  . thiamine  100 mg Intravenous Daily   Continuous Infusions: . sodium chloride       LOS: 1 day    Time spent: 35 minutes.     Kathlen Mody, MD [facility] Pager [phone]   If 7PM-7AM, please contact night-coverage [email or web address] Password [facility] 04/11/2018, 5:54 PM

## 2018-04-11 NOTE — Care Management Note (Addendum)
Case Management Note  Patient Details  Name: Roger Sanders MRN: [id number] Date of Birth: 08/15/1946  Subjective/Objective:       Pt in with spine anomaly. He is from home with his spouse.              Action/Plan: Awaiting MRI results to see if patient requires surgery. CM following.   Addendum: 04/12/2018: Pt sees Greta O[facility]bunch at [facility] as his P[facility].    Expected Discharge Date:                  Expected Discharge Plan:     In-House Referral:     Discharge planning Services     Post Acute Care Choice:    Choice offered to:     DME Arranged:    DME Agency:     HH Arranged:    HH Agency:     Status of Service:  In process, will continue to follow  If discussed at Long Length of Stay Meetings, dates discussed:    Additional Comments:  Kermit BaloKelli F Aliviya Schoeller, RN 04/11/2018, 3:13 PM

## 2018-04-11 NOTE — Progress Notes (Signed)
New patient not sure of exact time of admission but was transferred [facility] late this afternoon, Neuro surgery is consulting on possible cervical spine issue.

## 2018-04-11 NOTE — Progress Notes (Signed)
Patient ID: Roger RilingJames Armas, male   DOB: 09/11/1946, 72 y.o.   MRN: [id number] BP 122/64 (BP Location: Right Arm)   Pulse 74   Temp 98.1 F (36.7 C) (Oral)   Resp 18   SpO2 100%  Alert, oriented x 4, speech is clear and fluent Moving all extremities Is able to close hands today, obviously better Moving lower extremities better also, stood today with PT MRI shows stenosis at C3/4 posteriorly, no other level. Prior to seeing him tonight I planned on a simple decompression. But given the improvement I would rather he continue with PT for the moment and wait for the cord edema to subside.

## 2018-04-12 LAB — BASIC METABOLIC PANEL
Anion gap: 8 (ref 5–15)
BUN: 11 mg/dL (ref 6–20)
CO2: 25 mmol/L (ref 22–32)
CREATININE: 0.49 mg/dL — AB (ref 0.61–1.24)
Calcium: 9.3 mg/dL (ref 8.9–10.3)
Chloride: 97 mmol/L — ABNORMAL LOW (ref 101–111)
GFR calc Af Amer: >60 mL/min (ref >60)
GFR calc non Af Amer: >60 mL/min (ref >60)
GLUCOSE: 105 mg/dL — AB (ref 65–99)
Potassium: 4.4 mmol/L (ref 3.5–5.1)
Sodium: 130 mmol/L — ABNORMAL LOW (ref 135–145)

## 2018-04-12 LAB — OSMOLALITY: Osmolality: 276 mOsm/kg (ref 275–295)

## 2018-04-12 LAB — OSMOLALITY, URINE: Osmolality, Ur: 373 mOsm/kg (ref 300–900)

## 2018-04-12 LAB — TSH: TSH: 1.994 u[IU]/mL (ref 0.350–4.500)

## 2018-04-12 LAB — SODIUM, URINE, RANDOM: Sodium, Ur: 55 mmol/L

## 2018-04-12 MED ORDER — NICOTINE 21 MG/24HR TD PT24
21.0000 mg | MEDICATED_PATCH | Freq: Every day | TRANSDERMAL | Status: DC
  Administered 2018-04-12 – 2018-04-15 (×4): 21 mg via TRANSDERMAL
  Filled 2018-04-12 (×4): qty 1

## 2018-04-12 NOTE — Progress Notes (Signed)
Patient ID: Jasper RilingJames Couts, male   DOB: 07/19/1946, 72 y.o.   MRN: [id number] BP (!) 123/59 (BP Location: Left Arm)   Pulse 70   Temp 98.6 F (37 C) (Oral)   Resp 18   SpO2 96%  Continues to improve Needs pt, ot  To continue

## 2018-04-12 NOTE — Progress Notes (Signed)
PROGRESS NOTE    Roger Sanders  ZOX:[id number] DOB: 01-04-1946 DOA: 04/10/2018 PCP: Default, Provider, MD    Brief Narrative:  Roger Sanders is a 72 y.o. male with medical history significant of recent carpal tunnel surgery, previous back surgery transferred here from [facility] for neurosurgical evaluation.  Patient presented there with generalized weakness without any focal neurological deficits and had a stroke work-up.  He had a MRI of his C spine which showed C3-C4 possible  mass-effect on cord and swelling.  Dr. Franky Macho with neurosurgery was called who recommended patient being transferred here for neurosurgical evaluation. A repeat MRI of the cervical spine ordered. It showed Severe canal stenosis C3-4 with focal cord edema. No syrinx. Severe suspected bilateral C3-4 neural foraminal narrowing. Neurosurgery suggested working with PT as his symptoms are improving.       Assessment & Plan:   Principal Problem:   Spine anomaly Active Problems:   Generalized weakness   Spinal cord anomaly (HCC)   Severe spinal canal stenosis at C3 AND C4 with focal edema. : Further management as per neuro surgery . Pt's symptoms are improving.   Pain control and therapy eval for now.  Continue with therapy for now.    Mild normocytic anemia:  Hemoglobin stable around 12.    Hyponatremia:  Unclear etiology.  Suspect some dehydration. Repeat sodium today.  Get tsh, urine osmolarity, serum osmolarity.  Urine sodium ordered.    DVT prophylaxis: (SCD'S Code Status: full code.  Family Communication: none at bedside Disposition Plan: pending evaluation by neuro surgery.    Consultants:   Neuro surgery.   Procedures: MRI cervical spine.  MRI brain.   Antimicrobials:none.   Subjective: No chest pain or sob, no headache, or back pain.   Objective: Vitals:   04/11/18 2043 04/11/18 2356 04/12/18 0332 04/12/18 0752  BP: 120/66 115/67 120/66 112/71  Pulse: 80 77 72 81  Resp:  16 16 14 18   Temp: 98.4 F (36.9 C) 97.8 F (36.6 C) 98.1 F (36.7 C) 98.6 F (37 C)  TempSrc: Oral Oral Oral Oral  SpO2: 95% 97% 97% 98%    Intake/Output Summary (Last 24 hours) at 04/12/2018 1016 Last data filed at 04/12/2018 0800 Gross per 24 hour  Intake 680 ml  Output 800 ml  Net -120 ml   There were no vitals filed for this visit.  Examination:  General exam: Appears calm and comfortable , not in distress.  Respiratory system: Clear to auscultation. Respiratory effort normal. No wheezing or rhonchi.  Cardiovascular system: S1 & S2 heard, RRR. No JVD, murmurs,. No pedal edema. Gastrointestinal system: Abdomen is  Soft NT ND BS+ Central nervous system: Alert and oriented.able to move all extremities. . Extremities: no pedal edema, cyanosis.  Skin: No rashes, lesions or ulcers Psychiatry:Mood & affect appropriate.     Data Reviewed: I have personally reviewed following labs and imaging studies  CBC: Recent Labs  Lab 04/10/18 1919  WBC 5.1  NEUTROABS 2.1  HGB 12.4*  HCT 36.8*  MCV 97.6  PLT 345   Basic Metabolic Panel: Recent Labs  Lab 04/10/18 1919 04/11/18 0744  NA 132* 130*  K 4.1 4.3  CL 103 99*  CO2 21* 23  GLUCOSE 94 94  BUN 8 7  CREATININE 0.55* 0.56*  CALCIUM 8.8* 9.0  MG 1.7  --    GFR: CrCl cannot be calculated (Unknown ideal weight.). Liver Function Tests: Recent Labs  Lab 04/10/18 1919  AST 24  ALT 16*  ALKPHOS 55  BILITOT 0.5  PROT 6.5  ALBUMIN 3.5   No results for input(s): LIPASE, AMYLASE in the last 168 hours. No results for input(s): AMMONIA in the last 168 hours. Coagulation Profile: No results for input(s): INR, PROTIME in the last 168 hours. Cardiac Enzymes: No results for input(s): CKTOTAL, CKMB, CKMBINDEX, TROPONINI in the last 168 hours. BNP (last 3 results) No results for input(s): PROBNP in the last 8760 hours. HbA1C: No results for input(s): HGBA1C in the last 72 hours. CBG: No results for input(s): GLUCAP in  the last 168 hours. Lipid Profile: No results for input(s): CHOL, HDL, LDLCALC, TRIG, CHOLHDL, LDLDIRECT in the last 72 hours. Thyroid Function Tests: No results for input(s): TSH, T4TOTAL, FREET4, T3FREE, THYROIDAB in the last 72 hours. Anemia Panel: No results for input(s): VITAMINB12, FOLATE, FERRITIN, TIBC, IRON, RETICCTPCT in the last 72 hours. Sepsis Labs: No results for input(s): PROCALCITON, LATICACIDVEN in the last 168 hours.  No results found for this or any previous visit (from the past 240 hour(s)).       Radiology Studies: Mr Brain Wo Contrast  Result Date: 04/11/2018 CLINICAL DATA:  Unable to ambulate. Movement disorder. History of spinal cord anomaly, falls with alcoholism. Assess cervical spine stenosis. EXAM: MRI HEAD WITHOUT CONTRAST MRI CERVICAL SPINE WITHOUT CONTRAST TECHNIQUE: Multiplanar, multiecho pulse sequences of the brain and surrounding structures, and cervical spine, to include the craniocervical junction and cervicothoracic junction, were obtained without intravenous contrast. COMPARISON:  MRI head and cervical spine April 10, 2018. FINDINGS: Moderate motion degraded examination, worse than yesterday's examination. MRI HEAD FINDINGS INTRACRANIAL CONTENTS: No reduced diffusion to suggest acute ischemia. No susceptibility artifact to suggest hemorrhage. The ventricles and sulci are normal for patient's age. A few scattered subcentimeter supratentorial white matter FLAIR T2 hyperintensities compatible with mild chronic small vessel ischemic changes. No suspicious parenchymal signal, masses, mass effect. No abnormal extra-axial fluid collections. No extra-axial masses. VASCULAR: Normal major intracranial vascular flow voids present at skull base. SKULL AND UPPER CERVICAL SPINE: No abnormal sellar expansion. No suspicious calvarial bone marrow signal. Craniocervical junction maintained. SINUSES/ORBITS: Trace paranasal sinus mucosal thickening.The included ocular globes and  orbital contents are non-suspicious. OTHER: Patient is edentulous. Moderate to severely motion degraded examination, worse than yesterday's examination. MRI CERVICAL SPINE FINDINGS ALIGNMENT: Maintained cervical lordosis. Minimal C3-4 retrolisthesis. VERTEBRAE/DISCS: Vertebral bodies are intact. Intervertebral disc morphology's maintained with mild desiccation. Multilevel mild chronic discogenic endplate changes are without convincing evidence of acute or abnormal bone marrow signal abnormality. CORD:Cervical spinal cord compression at C3-4 with mild by a cord edema measuring approximately 1 cm. No syrinx or myelomalacia. POSTERIOR FOSSA, VERTEBRAL ARTERIES, PARASPINAL TISSUES: Limited assessment for ligamentous injury due to motion without definite paraspinal or ligamentous STIR signal abnormality. Vertebral artery flow voids generally maintained. DISC LEVELS: Severe canal stenosis at C3-4, 5 mm in AP dimension. No additional levels of severe canal stenosis. Severe suspected bilateral C3-4 neural foraminal narrowing. IMPRESSION: MRI head: 1. Moderately motion degraded examination, worse than yesterday's MRI head. 2. No acute intracranial process; negative noncontrast MRI head. MRI cervical spine: 1. Moderate to severely motion degraded examination, worse than yesterday's MRI cervical spine. 2. Severe canal stenosis C3-4 with focal cord edema. No syrinx. Severe suspected bilateral C3-4 neural foraminal narrowing. 3. Given inability to tolerate MRI, CT cervical spine may be of added value or, re-attempt MRI with sedation. Electronically Signed   By: Awilda Metro M.D.   On: 04/11/2018 15:20   Mr Cervical Spine Wo Contrast  Result Date: 04/11/2018 CLINICAL DATA:  Unable to ambulate. Movement disorder. History of spinal cord anomaly, falls with alcoholism. Assess cervical spine stenosis. EXAM: MRI HEAD WITHOUT CONTRAST MRI CERVICAL SPINE WITHOUT CONTRAST TECHNIQUE: Multiplanar, multiecho pulse sequences of the  brain and surrounding structures, and cervical spine, to include the craniocervical junction and cervicothoracic junction, were obtained without intravenous contrast. COMPARISON:  MRI head and cervical spine April 10, 2018. FINDINGS: Moderate motion degraded examination, worse than yesterday's examination. MRI HEAD FINDINGS INTRACRANIAL CONTENTS: No reduced diffusion to suggest acute ischemia. No susceptibility artifact to suggest hemorrhage. The ventricles and sulci are normal for patient's age. A few scattered subcentimeter supratentorial white matter FLAIR T2 hyperintensities compatible with mild chronic small vessel ischemic changes. No suspicious parenchymal signal, masses, mass effect. No abnormal extra-axial fluid collections. No extra-axial masses. VASCULAR: Normal major intracranial vascular flow voids present at skull base. SKULL AND UPPER CERVICAL SPINE: No abnormal sellar expansion. No suspicious calvarial bone marrow signal. Craniocervical junction maintained. SINUSES/ORBITS: Trace paranasal sinus mucosal thickening.The included ocular globes and orbital contents are non-suspicious. OTHER: Patient is edentulous. Moderate to severely motion degraded examination, worse than yesterday's examination. MRI CERVICAL SPINE FINDINGS ALIGNMENT: Maintained cervical lordosis. Minimal C3-4 retrolisthesis. VERTEBRAE/DISCS: Vertebral bodies are intact. Intervertebral disc morphology's maintained with mild desiccation. Multilevel mild chronic discogenic endplate changes are without convincing evidence of acute or abnormal bone marrow signal abnormality. CORD:Cervical spinal cord compression at C3-4 with mild by a cord edema measuring approximately 1 cm. No syrinx or myelomalacia. POSTERIOR FOSSA, VERTEBRAL ARTERIES, PARASPINAL TISSUES: Limited assessment for ligamentous injury due to motion without definite paraspinal or ligamentous STIR signal abnormality. Vertebral artery flow voids generally maintained. DISC LEVELS:  Severe canal stenosis at C3-4, 5 mm in AP dimension. No additional levels of severe canal stenosis. Severe suspected bilateral C3-4 neural foraminal narrowing. IMPRESSION: MRI head: 1. Moderately motion degraded examination, worse than yesterday's MRI head. 2. No acute intracranial process; negative noncontrast MRI head. MRI cervical spine: 1. Moderate to severely motion degraded examination, worse than yesterday's MRI cervical spine. 2. Severe canal stenosis C3-4 with focal cord edema. No syrinx. Severe suspected bilateral C3-4 neural foraminal narrowing. 3. Given inability to tolerate MRI, CT cervical spine may be of added value or, re-attempt MRI with sedation. Electronically Signed   By: Awilda Metroourtnay  Bloomer M.D.   On: 04/11/2018 15:20        Scheduled Meds: . folic acid  1 mg Oral Daily  . multivitamin with minerals  1 tablet Oral Daily  . nicotine  21 mg Transdermal Daily  . sodium chloride flush  3 mL Intravenous Q12H  . thiamine  100 mg Oral Daily   Or  . thiamine  100 mg Intravenous Daily   Continuous Infusions: . sodium chloride       LOS: 2 days    Time spent: 35 minutes.     Kathlen ModyVijaya Kiowa Hollar, MD [facility] Pager [phone]   If 7PM-7AM, please contact night-coverage [email or web address] Password [email or web address] 04/12/2018, 10:16 AM

## 2018-04-12 NOTE — Progress Notes (Signed)
Physical Therapy Treatment Patient Details Name: Roger RilingJames Olesen MRN: [id number] DOB: 08/24/1946 Today's Date: 04/12/2018    History of Present Illness Patient is a 72 y/o male admitted on 04/10/18 for neurosurgical evaluation due to general weakness, tremors, and gait difficulties. MRI revealing spinal stenosis at C3-4. Patient undergoing continued work-up. PMH significant for EOTH abuse, recent carpal tunnel surgery, prior back surgeries.     PT Comments    Patient is making gradual progress toward PT goals. Pt overall required min A for mobility this session and continues to demonstrate balance deficits with c/o lightheadedness with horizontal and vertical head turns.  Given pt's current mobility level recommending SNF for further skilled PT services to maximize independence and safety with mobility.   Follow Up Recommendations  Supervision for mobility/OOB;SNF     Equipment Recommendations  Rolling walker with 5" wheels    Recommendations for Other Services OT consult     Precautions / Restrictions Precautions Precautions: Fall Restrictions Weight Bearing Restrictions: No    Mobility  Bed Mobility Overal bed mobility: Needs Assistance Bed Mobility: Supine to Sit     Supine to sit: Min assist     General bed mobility comments: cues for sequencing and assist to elevate trunk into sitting  Transfers Overall transfer level: Needs assistance Equipment used: Rolling walker (2 wheeled) Transfers: Sit to/from Stand Sit to Stand: Min assist         General transfer comment: assist to steady; cues for safe hand placement when using AD; pt with tremors that are exaggerated once in standing  Ambulation/Gait Ambulation/Gait assistance: Min assist Ambulation Distance (Feet): 50 Feet Assistive device: Rolling walker (2 wheeled) Gait Pattern/deviations: Step-through pattern;Decreased step length - right;Decreased step length - left;Ataxic;Trunk flexed;Narrow base of support Gait  velocity: decreased   General Gait Details: pt with ataxic LE movements and requires assistance for balance; pt c/o lightheadedness with horizontal and vertical head turns and demonstrated increased unsteadiness; no LOB   Stairs             Wheelchair Mobility    Modified Rankin (Stroke Patients Only) Modified Rankin (Stroke Patients Only) Pre-Morbid Rankin Score: No significant disability Modified Rankin: Moderately severe disability     Balance Overall balance assessment: Needs assistance Sitting-balance support: Single extremity supported;Feet supported Sitting balance-Leahy Scale: Fair     Standing balance support: Bilateral upper extremity supported Standing balance-Leahy Scale: Poor                              Cognition Arousal/Alertness: Awake/alert Behavior During Therapy: WFL for tasks assessed/performed Overall Cognitive Status: No family/caregiver present to determine baseline cognitive functioning Area of Impairment: Attention;Awareness;Problem solving                   Current Attention Level: Selective       Awareness: Emergent Problem Solving: Slow processing;Difficulty sequencing;Requires verbal cues        Exercises      General Comments        Pertinent Vitals/Pain Pain Assessment: No/denies pain    Home Living                      Prior Function            PT Goals (current goals can now be found in the care plan section) Acute Rehab PT Goals Patient Stated Goal: get back to normal PT Goal Formulation: With patient Time For  Goal Achievement: 04/25/18 Potential to Achieve Goals: Good Progress towards PT goals: Progressing toward goals    Frequency    Min 4X/week      PT Plan Current plan remains appropriate    Co-evaluation              AM-PAC PT "6 Clicks" Daily Activity  Outcome Measure  Difficulty turning over in bed (including adjusting bedclothes, sheets and blankets)?: A  Little Difficulty moving from lying on back to sitting on the side of the bed? : Unable Difficulty sitting down on and standing up from a chair with arms (e.g., wheelchair, bedside commode, etc,.)?: Unable Help needed moving to and from a bed to chair (including a wheelchair)?: A Little Help needed walking in hospital room?: A Little Help needed climbing 3-5 steps with a railing? : A Lot 6 Click Score: 13    End of Session Equipment Utilized During Treatment: Gait belt Activity Tolerance: Patient tolerated treatment well Patient left: in chair;with call bell/phone within reach;with chair alarm set Nurse Communication: Mobility status PT Visit Diagnosis: Unsteadiness on feet (R26.81);Other abnormalities of gait and mobility (R26.89);Difficulty in walking, not elsewhere classified (R26.2);Muscle weakness (generalized) (M62.81)     Time: 1308-6578 PT Time Calculation (min) (ACUTE ONLY): 22 min  Charges:  $Gait Training: 8-22 mins                    G Codes:       Erline Levine, PTA Pager: [phone]     Carolynne Edouard 04/12/2018, 2:45 PM

## 2018-04-13 MED ORDER — ALPRAZOLAM 0.5 MG PO TABS: 0.5000 mg | ORAL_TABLET | ORAL | Status: DC

## 2018-04-13 MED ORDER — POLYETHYLENE GLYCOL 3350 17 G PO PACK
17.0000 g | PACK | Freq: Every day | ORAL | Status: DC | PRN
  Administered 2018-04-14: 17 g via ORAL
  Filled 2018-04-13: qty 1

## 2018-04-13 MED ORDER — SENNOSIDES-DOCUSATE SODIUM 8.6-50 MG PO TABS
2.0000 | ORAL_TABLET | Freq: Two times a day (BID) | ORAL | Status: DC
  Administered 2018-04-13 – 2018-04-15 (×4): 2 via ORAL
  Filled 2018-04-13 (×4): qty 2

## 2018-04-13 MED ORDER — ALPRAZOLAM 0.5 MG PO TABS
0.5000 mg | ORAL_TABLET | Freq: Three times a day (TID) | ORAL | Status: DC | PRN
  Administered 2018-04-13 – 2018-04-15 (×3): 0.5 mg via ORAL
  Filled 2018-04-13 (×3): qty 1

## 2018-04-13 MED ORDER — ALBUTEROL SULFATE (2.5 MG/3ML) 0.083% IN NEBU: 2.5000 mg | INHALATION_SOLUTION | Freq: Four times a day (QID) | RESPIRATORY_TRACT | Status: DC | PRN

## 2018-04-13 MED ORDER — BUSPIRONE HCL 10 MG PO TABS
10.0000 mg | ORAL_TABLET | Freq: Two times a day (BID) | ORAL | Status: DC | PRN
  Administered 2018-04-14: 10 mg via ORAL
  Filled 2018-04-13: qty 1

## 2018-04-13 MED ORDER — GABAPENTIN 300 MG PO CAPS: 300.0000 mg | ORAL_CAPSULE | Freq: Three times a day (TID) | ORAL | Status: DC | PRN

## 2018-04-13 NOTE — Progress Notes (Signed)
I spoke with patient's brother and gave him an update on patient's care.

## 2018-04-13 NOTE — Progress Notes (Signed)
PROGRESS NOTE    Roger RilingJames Sanders  QMV:[id number] DOB: 02/22/1946 DOA: 04/10/2018 PCP: Default, Provider, MD    Brief Narrative:  Roger Sanders is a 72 y.o. male with medical history significant of recent carpal tunnel surgery, previous back surgery transferred [facility] for [facility] evaluation.  Patient presented there with generalized weakness without any focal neurological deficits and had a stroke work-up.  He had a MRI of his C spine which showed C3-C4 possible  mass-effect on cord and swelling.  Dr. Franky Machoabbell with neurosurgery was called who recommended patient being transferred here for neurosurgical evaluation. A repeat MRI of the cervical spine ordered. It showed Severe canal stenosis C3-4 with focal cord edema. No syrinx. Severe suspected bilateral C3-4 neural foraminal narrowing. Neurosurgery suggested working with PT as his symptoms are improving.       Assessment & Plan:   Principal Problem:   Spine anomaly Active Problems:   Generalized weakness   Spinal cord anomaly (HCC)   Severe spinal canal stenosis at C3 AND C4 with focal edema. : Further management as per neuro surgery . Pt's symptoms are improving but he reports his left arm strength is still at 3/5. He is working with PT and recommendations for SNF on discharge.  Pain control Continue with therapy for now.    Mild normocytic anemia:  Hemoglobin stable around 12.    Hyponatremia:  Sodium is 130 and stable.  tsh wnl.     DVT prophylaxis: (SCD'S Code Status: full code.  Family Communication: none at bedside Disposition Plan: SNF on discharge.    Consultants:   Neuro surgery.   Procedures: MRI cervical spine.  MRI brain.   Antimicrobials:none.   Subjective: No chest pain or sob, no headache, or back pain.  Reports his arm strength is improving.   Objective: Vitals:   04/13/18 0445 04/13/18 0754 04/13/18 1117 04/13/18 1520  BP: 122/63 120/61 120/67 117/60  Pulse: 80 74 80 77   Resp: 20 20 20 20   Temp: 98.4 F (36.9 C) 97.9 F (36.6 C) 98.4 F (36.9 C) 98.3 F (36.8 C)  TempSrc: Oral Oral Oral Oral  SpO2: 99% 97% 98% 98%    Intake/Output Summary (Last 24 hours) at 04/13/2018 1718 Last data filed at 04/13/2018 1400 Gross per 24 hour  Intake 680 ml  Output 600 ml  Net 80 ml   There were no vitals filed for this visit.  Examination:  General exam: Appears calm and comfortable , not in distress.  Respiratory system: Clear to auscultation. Respiratory effort normal. No wheezing or rhonchi.  Cardiovascular system: S1 & S2 heard, RRR. No JVD, murmurs,. No pedal edema. Gastrointestinal system: Abdomen is  Soft NT ND BS+ Central nervous system: Alert and oriented.able to move all extremities. . Extremities: no pedal edema, cyanosis. Left arm strength is around 3/5.  Skin: No rashes, lesions or ulcers Psychiatry:Mood & affect appropriate.     Data Reviewed: I have personally reviewed following labs and imaging studies  CBC: Recent Labs  Lab 04/10/18 1919  WBC 5.1  NEUTROABS 2.1  HGB 12.4*  HCT 36.8*  MCV 97.6  PLT 345   Basic Metabolic Panel: Recent Labs  Lab 04/10/18 1919 04/11/18 0744 04/12/18 0938  NA 132* 130* 130*  K 4.1 4.3 4.4  CL 103 99* 97*  CO2 21* 23 25  GLUCOSE 94 94 105*  BUN 8 7 11   CREATININE 0.55* 0.56* 0.49*  CALCIUM 8.8* 9.0 9.3  MG 1.7  --   --  GFR: CrCl cannot be calculated (Unknown ideal weight.). Liver Function Tests: Recent Labs  Lab 04/10/18 1919  AST 24  ALT 16*  ALKPHOS 55  BILITOT 0.5  PROT 6.5  ALBUMIN 3.5   No results for input(s): LIPASE, AMYLASE in the last 168 hours. No results for input(s): AMMONIA in the last 168 hours. Coagulation Profile: No results for input(s): INR, PROTIME in the last 168 hours. Cardiac Enzymes: No results for input(s): CKTOTAL, CKMB, CKMBINDEX, TROPONINI in the last 168 hours. BNP (last 3 results) No results for input(s): PROBNP in the last 8760  hours. HbA1C: No results for input(s): HGBA1C in the last 72 hours. CBG: No results for input(s): GLUCAP in the last 168 hours. Lipid Profile: No results for input(s): CHOL, HDL, LDLCALC, TRIG, CHOLHDL, LDLDIRECT in the last 72 hours. Thyroid Function Tests: Recent Labs    04/12/18 1155  TSH 1.994   Anemia Panel: No results for input(s): VITAMINB12, FOLATE, FERRITIN, TIBC, IRON, RETICCTPCT in the last 72 hours. Sepsis Labs: No results for input(s): PROCALCITON, LATICACIDVEN in the last 168 hours.  No results found for this or any previous visit (from the past 240 hour(s)).       Radiology Studies: No results found.      Scheduled Meds: . folic acid  1 mg Oral Daily  . multivitamin with minerals  1 tablet Oral Daily  . nicotine  21 mg Transdermal Daily  . sodium chloride flush  3 mL Intravenous Q12H  . thiamine  100 mg Oral Daily   Or  . thiamine  100 mg Intravenous Daily   Continuous Infusions: . sodium chloride       LOS: 3 days    Time spent: 35 minutes.     Kathlen Mody, MD [facility] Pager [phone]   If 7PM-7AM, please contact night-coverage [email or web address] Password [email or web address] 04/13/2018, 5:18 PM

## 2018-04-13 NOTE — Progress Notes (Signed)
Neurosurgery Progress Note  No issues overnight. Complains of diffuse weakness, but overall stable Has been working with PT/OT  EXAM:  BP 120/61 (BP Location: Left Arm)   Pulse 74   Temp 97.9 F (36.6 C) (Oral)   Resp 20   SpO2 97%   Awake, alert, oriented  Moves all extremities but not smoothly, especially with LUE Decreased strength diffuse LUE and right grip strength but otherwise grossly normal Scar left palm secondary to recent surgery  PLAN Stable this am Continue to PT/OT through the weekend Dr Franky Machoabbell to f/u Monday

## 2018-04-13 NOTE — Progress Notes (Signed)
Patient straining to have a bowel movement, MD paged for orders.

## 2018-04-13 NOTE — NC FL2 (Signed)
[place] MEDICAID FL2 LEVEL OF CARE SCREENING TOOL     IDENTIFICATION  Patient Name: Roger Sanders Birthdate: 09/30/1946 Sex: male Admission Date (Current Location): 04/10/2018  [facility] [place]   Facility and Address:  [facility] [place], [address], [place], [place] [address]      Provider Number: [id number]  Attending Physician Name and Address:  Kathlen ModyAkula, Vijaya, MD  Relative Name and Phone Number:  Nadean CorwinKaren Janus, wife    Current Level of Care: Hospital Recommended Level of Care: Skilled Nursing Facility Prior Approval Number:    Date Approved/Denied:   PASRR Number: [id number]  Discharge Plan: SNF    Current Diagnoses: Patient Active Problem List   Diagnosis Date Noted  . Generalized weakness 04/10/2018  . Spine anomaly 04/10/2018  . Spinal cord anomaly (HCC) 04/10/2018    Orientation RESPIRATION BLADDER Height & Weight     Self, Time, Situation, Place  Normal Continent Weight:   Height:     BEHAVIORAL SYMPTOMS/MOOD NEUROLOGICAL BOWEL NUTRITION STATUS      Continent Diet(see discharge summary)  AMBULATORY STATUS COMMUNICATION OF NEEDS Skin   Limited Assist Verbally Other (Comment)(jaundiced; dry skin)                       Personal Care Assistance Level of Assistance  Bathing, Feeding, Dressing Bathing Assistance: Limited assistance Feeding assistance: Independent Dressing Assistance: Limited assistance     Functional Limitations Info  Hearing, Sight, Speech Sight Info: Adequate Hearing Info: Adequate Speech Info: Adequate    SPECIAL CARE FACTORS FREQUENCY  PT (By licensed PT), OT (By licensed OT)     PT Frequency: 5x week OT Frequency: 5x week            Contractures Contractures Info: Not present    Additional Factors Info  Code Status, Allergies Code Status Info: Full Code Allergies Info: No Known Allergies           Current Medications (04/13/2018):  This is the current hospital active  medication list Current Facility-Administered Medications  Medication Dose Route Frequency Provider Last Rate Last Dose  . 0.9 %  sodium chloride infusion  250 mL Intravenous PRN Haydee Monicaavid, Rachal A, MD      . folic acid (FOLVITE) tablet 1 mg  1 mg Oral Daily Coletta Memosabbell, Kyle, MD   1 mg at 04/13/18 [id number]  . LORazepam (ATIVAN) tablet 1 mg  1 mg Oral Q6H PRN Coletta Memosabbell, Kyle, MD   1 mg at 04/12/18 2145   Or  . LORazepam (ATIVAN) injection 1 mg  1 mg Intravenous Q6H PRN Coletta Memosabbell, Kyle, MD   1 mg at 04/11/18 1824  . multivitamin with minerals tablet 1 tablet  1 tablet Oral Daily Coletta Memosabbell, Kyle, MD   1 tablet at 04/13/18 0812  . nicotine (NICODERM CQ - dosed in mg/24 hours) patch 21 mg  21 mg Transdermal Daily Kathlen ModyAkula, Vijaya, MD   21 mg at 04/13/18 [id number]  . sodium chloride flush (NS) 0.9 % injection 3 mL  3 mL Intravenous Q12H Tarry Kosavid, Rachal A, MD   3 mL at 04/13/18 0813  . sodium chloride flush (NS) 0.9 % injection 3 mL  3 mL Intravenous PRN Tarry Kosavid, Rachal A, MD      . thiamine (VITAMIN B-1) tablet 100 mg  100 mg Oral Daily Coletta Memosabbell, Kyle, MD   100 mg at 04/13/18 [id number]   Or  . thiamine (B-1) injection 100 mg  100 mg Intravenous Daily  Coletta Memos, MD   100 mg at 04/10/18 2050     Discharge Medications: Please see discharge summary for a list of discharge medications.  Relevant Imaging Results:  Relevant Lab Results:   Additional Information SS# [id number] [place], [place]

## 2018-04-13 NOTE — Evaluation (Signed)
Occupational Therapy Evaluation Patient Details Name: Zyrell MarshJasper Riling MRN: [id number] DOB: 03/19/1946 Today's Date: 04/13/2018    History of Present Illness Patient is a 72 y/o male admitted on 04/10/18 for neurosurgical evaluation due to general weakness, tremors, and gait difficulties. MRI revealing spinal stenosis at C3-4. Patient undergoing continued work-up. PMH significant for EOTH abuse, recent carpal tunnel surgery, prior back surgeries.    Clinical Impression   PTA, pt was living alone and able to complete basic ADL independently. He does not drive. Pt currently presenting with B LE weakness, dizziness, decreased balance, decreased distal B UE strength with notably poor instrinsic hand strength bilaterally. He requires min assist for self-feeding and grooming, min assist for UB ADL, mod assist for LB ADL, and min assist for stand-pivot simulated toilet transfer. Pt would benefit from continued OT services while admitted to improve independence and safety with ADL and functional mobility. Recommend SNF level rehabilitation post-acute D/C to maximize functional independence prior to returning home.     Follow Up Recommendations  Supervision/Assistance - 24 hour;SNF    Equipment Recommendations  Other (comment)(defer to next venue of care)    Recommendations for Other Services Rehab consult     Precautions / Restrictions Precautions Precautions: Fall Restrictions Weight Bearing Restrictions: No      Mobility Bed Mobility Overal bed mobility: Needs Assistance Bed Mobility: Supine to Sit     Supine to sit: Min guard     General bed mobility comments: Guarding for safety.   Transfers Overall transfer level: Needs assistance Equipment used: Rolling walker (2 wheeled) Transfers: Sit to/from Stand Sit to Stand: Min assist         General transfer comment: Assist to power up and to steady.     Balance Overall balance assessment: Needs assistance Sitting-balance support:  Single extremity supported;Feet supported Sitting balance-Leahy Scale: Fair     Standing balance support: Bilateral upper extremity supported Standing balance-Leahy Scale: Poor Standing balance comment: Relies on RW and external assistance                           ADL either performed or assessed with clinical judgement   ADL Overall ADL's : Needs assistance/impaired Eating/Feeding: Minimal assistance;Sitting Eating/Feeding Details (indicate cue type and reason): Provided tubing for built-up handles improving independence. Grooming: Minimal assistance;Sitting   Upper Body Bathing: Minimal assistance;Sitting   Lower Body Bathing: Moderate assistance;Sit to/from stand   Upper Body Dressing : Minimal assistance;Sitting   Lower Body Dressing: Moderate assistance;Sit to/from stand   Toilet Transfer: Minimal assistance;Stand-pivot;RW(taking a few pivotal steps) Toilet Transfer Details (indicate cue type and reason): Min assist to power up to standing and maintain stability on feel.  Toileting- Clothing Manipulation and Hygiene: Moderate assistance;Sit to/from stand       Functional mobility during ADLs: Minimal assistance;Rolling walker General ADL Comments: Pt reports dizziness when first sitting up and standing. Resolved once resting in recliner.      Vision Patient Visual Report: No change from baseline Vision Assessment?: No apparent visual deficits     Perception     Praxis      Pertinent Vitals/Pain Pain Assessment: No/denies pain     Hand Dominance     Extremity/Trunk Assessment Upper Extremity Assessment Upper Extremity Assessment: RUE deficits/detail;LUE deficits/detail("everything feels slippery") RUE Deficits / Details: Shoulder WFL, decreased strength at elbow 4/5, weakness significantly noted in intrinsic hand muscles and unable to demonstrate intrinsic plus position. When asked to make fist will  create clawed position. Reports hypersensitivity.   RUE Sensation: decreased light touch RUE Coordination: decreased fine motor;decreased gross motor LUE Deficits / Details: Shoulder WFL, decreased strength at elbow 4/5, weakness significantly noted in intrinsic hand muscles and unable to demonstrate intrinsic plus position. When asked to make fist will create clawed position. Reports hypersensitivity. LUE Sensation: decreased light touch LUE Coordination: decreased fine motor;decreased gross motor   Lower Extremity Assessment Lower Extremity Assessment: Defer to PT evaluation       Communication Communication Communication: No difficulties   Cognition Arousal/Alertness: Awake/alert Behavior During Therapy: WFL for tasks assessed/performed Overall Cognitive Status: No family/caregiver present to determine baseline cognitive functioning Area of Impairment: Attention;Awareness;Problem solving                   Current Attention Level: Selective(easily distracted by TV noise)       Awareness: Emergent Problem Solving: Slow processing General Comments: Pt requiring increased time for processing. Easily distracted by external stimuli.    General Comments       Exercises Exercises: Other exercises Other Exercises Other Exercises: Educated concerning composite flexion and extension.  Other Exercises: Educated concerning therapy putty exercises with composite flexion, flexion of each digit against palm with putty and tip pinches. Provided yellow putty.    Shoulder Instructions      Home Living Family/patient expects to be discharged to:: Private residence Living Arrangements: Alone Available Help at Discharge: Friend(s);Available PRN/intermittently Type of Home: Mobile home(double wide) Home Access: Ramped entrance;Stairs to enter Entrance Stairs-Number of Steps: 2   Home Layout: One level     Bathroom Shower/Tub: Chief Strategy Officer: Standard     Home Equipment: Cane - single point           Prior Functioning/Environment Level of Independence: Independent with assistive device(s)        Comments: Use of SPC at times.         OT Problem List: Decreased strength;Decreased range of motion;Decreased activity tolerance;Impaired balance (sitting and/or standing);Decreased coordination;Decreased safety awareness;Decreased knowledge of use of DME or AE;Decreased knowledge of precautions;Pain;Impaired UE functional use      OT Treatment/Interventions: Self-care/ADL training;Energy conservation;DME and/or AE instruction;Therapeutic activities;Patient/family education;Balance training;Neuromuscular education;Therapeutic exercise;Splinting;Manual therapy    OT Goals(Current goals can be found in the care plan section) Acute Rehab OT Goals Patient Stated Goal: back to normal and go home OT Goal Formulation: With patient Time For Goal Achievement: 04/27/18 Potential to Achieve Goals: Good ADL Goals Pt Will Perform Eating: with modified independence;with adaptive utensils;sitting Pt Will Perform Grooming: with modified independence;sitting Pt Will Perform Upper Body Dressing: with modified independence;sitting Pt Will Perform Lower Body Dressing: with modified independence;sit to/from stand Pt Will Transfer to Toilet: with supervision;ambulating;regular height toilet Pt Will Perform Toileting - Clothing Manipulation and hygiene: with supervision;sit to/from stand Pt/caregiver will Perform Home Exercise Program: With written HEP provided;With theraputty;Both right and left upper extremity(intrinsic strengthening B hands)  OT Frequency: Min 2X/week   Barriers to D/C:            Co-evaluation              AM-PAC PT "6 Clicks" Daily Activity     Outcome Measure Help from another person eating meals?: A Little Help from another person taking care of personal grooming?: A Little Help from another person toileting, which includes using toliet, bedpan, or urinal?: A Lot Help  from another person bathing (including washing, rinsing, drying)?: A Little Help from another person  to put on and taking off regular upper body clothing?: A Little Help from another person to put on and taking off regular lower body clothing?: A Lot 6 Click Score: 16   End of Session Equipment Utilized During Treatment: Gait belt;Rolling walker Nurse Communication: Mobility status;Other (comment)(nurse tech - pt needs to have tubing placed on utensils)  Activity Tolerance: Patient tolerated treatment well Patient left: in chair;with call bell/phone within reach;with chair alarm set  OT Visit Diagnosis: Other abnormalities of gait and mobility (R26.89);Muscle weakness (generalized) (M62.81)                Time: 1610-9604 OT Time Calculation (min): 25 min Charges:  OT General Charges $OT Visit: 1 Visit OT Evaluation $OT Eval Moderate Complexity: 1 Mod OT Treatments $Self Care/Home Management : 8-22 mins G-Codes:     Doristine Section, MS OTR/L  Pager: [phone]   Shayonna Ocampo A Trevor Duty 04/13/2018, 6:44 PM

## 2018-04-13 NOTE — Social Work (Addendum)
Await neurosurgical recommendations whether or not pt will have surgery.  FL2 complete, can be updated as the course of pt's stay becomes more clear.   Doy HutchingIsabel H Dudley Mages, LCSWA [facility] Clinical Social Work [phone] or [phone] (weekend phone)

## 2018-04-14 LAB — CBC
HCT: 40.9 % (ref 39.0–52.0)
Hemoglobin: 14.2 g/dL (ref 13.0–17.0)
MCH: 32.9 pg (ref 26.0–34.0)
MCHC: 34.7 g/dL (ref 30.0–36.0)
MCV: 94.7 fL (ref 78.0–100.0)
Platelets: 390 K/uL (ref 150–400)
RBC: 4.32 MIL/uL (ref 4.22–5.81)
RDW: 11.6 % (ref 11.5–15.5)
WBC: 5.8 K/uL (ref 4.0–10.5)

## 2018-04-14 LAB — BASIC METABOLIC PANEL
ANION GAP: 7 (ref 5–15)
BUN: 15 mg/dL (ref 6–20)
CALCIUM: 9.4 mg/dL (ref 8.9–10.3)
CO2: 27 mmol/L (ref 22–32)
Chloride: 100 mmol/L — ABNORMAL LOW (ref 101–111)
Creatinine, Ser: 0.42 mg/dL — ABNORMAL LOW (ref 0.61–1.24)
Glucose, Bld: 102 mg/dL — ABNORMAL HIGH (ref 65–99)
Potassium: 4.3 mmol/L (ref 3.5–5.1)
SODIUM: 134 mmol/L — AB (ref 135–145)

## 2018-04-14 NOTE — Progress Notes (Signed)
CSW met with patient to discuss recommendations for SNF. CSW explained SNF placement, and patient stated he wanted to go home instead. CSW asked about 24 hour care, because 24 hour supervision is recommended, and patient has a friend who will be coming to stay with him and will be able to provide 24 hour care. Patient would like home health services set up.  CSW to alert RNCM. CSW signing off.  Laveda Abbe, [place] Clinical Social Worker [phone]

## 2018-04-14 NOTE — Plan of Care (Signed)
Patient stable, discussed POC with patient, denies question/concerns at this time.  

## 2018-04-14 NOTE — Progress Notes (Signed)
PROGRESS NOTE    Roger Sanders  UJW:[id number] DOB: Oct 28, 1946 DOA: 04/10/2018 PCP: Default, Provider, MD    Brief Narrative:  Roger Sanders is a 72 y.o. male with medical history significant of recent carpal tunnel surgery, previous back surgery transferred here from [facility] for neurosurgical evaluation.  Patient presented there with generalized weakness without any focal neurological deficits and had a stroke work-up.  He had a MRI of his C spine which showed C3-C4 possible  mass-effect on cord and swelling.  Dr. Franky Macho with neurosurgery was called who recommended patient being transferred here for neurosurgical evaluation. A repeat MRI of the cervical spine ordered. It showed Severe canal stenosis C3-4 with focal cord edema. No syrinx. Severe suspected bilateral C3-4 neural foraminal narrowing. Neurosurgery suggested working with PT as his symptoms are improving.       Assessment & Plan:   Principal Problem:   Spine anomaly Active Problems:   Generalized weakness   Spinal cord anomaly (HCC)   Severe spinal canal stenosis at C3 AND C4 with focal edema. : Further management as per neuro surgery . Pt's symptoms are improving but he reports his left arm strength is still at 3/5. He is working with PT and recommendations for SNF on discharge.  Pain control Continue with therapy for now.  The left upper arm strength is improving.    Mild normocytic anemia:  Hemoglobin stable    Hyponatremia:  Sodium is 134 and stable.  tsh wnl.     DVT prophylaxis: (SCD'S Code Status: full code.  Family Communication: none at bedside Disposition Plan: though recommendations are for SNF, pt wants to go home with home PT.    Consultants:   Neuro surgery.   Procedures: MRI cervical spine.  MRI brain.   Antimicrobials:none.   Subjective: No new complaints.   Objective: Vitals:   04/14/18 0418 04/14/18 0731 04/14/18 1231 04/14/18 1544  BP: 109/65 111/68 123/67 121/68    Pulse: 89 80 83 78  Resp: 18 20 16 20   Temp: 98 F (36.7 C) 98.3 F (36.8 C) 97.8 F (36.6 C) 97.8 F (36.6 C)  TempSrc: Oral Oral Oral Oral  SpO2: 97% 96% 98% 99%    Intake/Output Summary (Last 24 hours) at 04/14/2018 1911 Last data filed at 04/14/2018 1802 Gross per 24 hour  Intake 840 ml  Output 1500 ml  Net -660 ml   There were no vitals filed for this visit.  Examination:  General exam: calm and in good spirits.  Respiratory system: air entry fair, no wheezing or rhonchi.  Cardiovascular system: S1 & S2 heard, RRR. No JVD, murmurs,. No pedal edema. Gastrointestinal system: Abdomen is  Soft  Bowel sounds good.  Central nervous system: Alert and oriented.able to move all extremities. Marland Kitchenleft arm strength is 3/5 Extremities: no pedal edema, cyanosis. Left arm strength is around 3/5.  Skin: No rashes, lesions or ulcers Psychiatry:Mood & affect appropriate.     Data Reviewed: I have personally reviewed following labs and imaging studies  CBC: Recent Labs  Lab 04/10/18 1919 04/14/18 0631  WBC 5.1 5.8  NEUTROABS 2.1  --   HGB 12.4* 14.2  HCT 36.8* 40.9  MCV 97.6 94.7  PLT 345 390   Basic Metabolic Panel: Recent Labs  Lab 04/10/18 1919 04/11/18 0744 04/12/18 0938 04/14/18 0631  NA 132* 130* 130* 134*  K 4.1 4.3 4.4 4.3  CL 103 99* 97* 100*  CO2 21* 23 25 27   GLUCOSE 94 94 105* 102*  BUN  8 7 11 15   CREATININE 0.55* 0.56* 0.49* 0.42*  CALCIUM 8.8* 9.0 9.3 9.4  MG 1.7  --   --   --    GFR: CrCl cannot be calculated (Unknown ideal weight.). Liver Function Tests: Recent Labs  Lab 04/10/18 1919  AST 24  ALT 16*  ALKPHOS 55  BILITOT 0.5  PROT 6.5  ALBUMIN 3.5   No results for input(s): LIPASE, AMYLASE in the last 168 hours. No results for input(s): AMMONIA in the last 168 hours. Coagulation Profile: No results for input(s): INR, PROTIME in the last 168 hours. Cardiac Enzymes: No results for input(s): CKTOTAL, CKMB, CKMBINDEX, TROPONINI in the last  168 hours. BNP (last 3 results) No results for input(s): PROBNP in the last 8760 hours. HbA1C: No results for input(s): HGBA1C in the last 72 hours. CBG: No results for input(s): GLUCAP in the last 168 hours. Lipid Profile: No results for input(s): CHOL, HDL, LDLCALC, TRIG, CHOLHDL, LDLDIRECT in the last 72 hours. Thyroid Function Tests: Recent Labs    04/12/18 1155  TSH 1.994   Anemia Panel: No results for input(s): VITAMINB12, FOLATE, FERRITIN, TIBC, IRON, RETICCTPCT in the last 72 hours. Sepsis Labs: No results for input(s): PROCALCITON, LATICACIDVEN in the last 168 hours.  No results found for this or any previous visit (from the past 240 hour(s)).       Radiology Studies: No results found.      Scheduled Meds: . folic acid  1 mg Oral Daily  . multivitamin with minerals  1 tablet Oral Daily  . nicotine  21 mg Transdermal Daily  . senna-docusate  2 tablet Oral BID  . sodium chloride flush  3 mL Intravenous Q12H  . thiamine  100 mg Oral Daily   Or  . thiamine  100 mg Intravenous Daily   Continuous Infusions: . sodium chloride Stopped (04/14/18 1600)     LOS: 4 days    Time spent: 35 minutes.     Kathlen ModyVijaya Eron Goble, MD [facility] Pager [phone]   If 7PM-7AM, please contact night-coverage [email or web address] Password [email or web address] 04/14/2018, 7:11 PM

## 2018-04-14 NOTE — Progress Notes (Signed)
Neurosurgery Progress Note  No issues overnight.  Denies any changes.   EXAM:  BP 111/68 (BP Location: Left Arm)   Pulse 80   Temp 98.3 F (36.8 C) (Oral)   Resp 20   SpO2 96%   Awake, alert, oriented  Moves all extremities but not smoothly, especially with LUE Decreased strength diffuse LUE and right grip strength but otherwise grossly normal Scar left palm secondary to recent surgery  PLAN Neuro exam unchanged this am Continue to PT/OT  Dr Franky Machoabbell to f/u tomorrow

## 2018-04-15 MED ORDER — THIAMINE HCL 100 MG PO TABS: 100.0000 mg | ORAL_TABLET | Freq: Every day | ORAL | 0 refills | Status: AC

## 2018-04-15 MED ORDER — ADULT MULTIVITAMIN W/MINERALS CH: 1.0000 | ORAL_TABLET | Freq: Every day | ORAL | 0 refills | Status: AC

## 2018-04-15 MED ORDER — FOLIC ACID 1 MG PO TABS: 1.0000 mg | ORAL_TABLET | Freq: Every day | ORAL | 0 refills | Status: AC

## 2018-04-15 NOTE — Care Management Note (Signed)
Case Management Note  Patient Details  Name: Roger Sanders MRN: [id number] Date of Birth: [birth year]  Subjective/Objective:                    Action/Plan: Plan is for patient to d/c home with [facility] services. CM met with the patient and provided him choice. Selection was [facility]. Carolyn with [facility] notified and accepted the referral.  Pt with orders for walker. Ilan with [facility] notified and will deliver to the room. Pt has transportation home with his brother and states he has a friend that can provide 24 hour care at d/c.    Expected Discharge Date:                  Expected Discharge Plan:  [place]  In-House Referral:     Discharge planning Services  CM Consult  Post Acute Care Choice:  Durable Medical Equipment, Home Health Choice offered to:  Patient  DME Arranged:  Walker rolling DME Agency:  [place] Arranged:  RN, OT, PT, Nurse's Aide, Social Work [employer] Agency:  [place]  Status of Service:  Completed, signed off  If discussed at [employer], dates discussed:    Additional Comments:  Pollie Friar, RN 04/15/2018, 10:39 AM

## 2018-04-15 NOTE — Progress Notes (Signed)
Physical Therapy Treatment Patient Details Name: Roger Sanders MRN: [id number] DOB: 1946/10/24 Today's Date: 04/15/2018    History of Present Illness Patient is a 72 y/o male admitted on 04/10/18 for neurosurgical evaluation due to general weakness, tremors, and gait difficulties. MRI revealing spinal stenosis at C3-4. Patient undergoing continued work-up. PMH significant for EOTH abuse, recent carpal tunnel surgery, prior back surgeries.     PT Comments    Patient is making progress toward PT goals. Pt required min guard/min A for mobility during session and continue to benefit from further skilled PT services to maximize independence and safety with mobility.    Follow Up Recommendations  Supervision for mobility/OOB;SNF     Equipment Recommendations  Rolling walker with 5" wheels    Recommendations for Other Services       Precautions / Restrictions Precautions Precautions: Fall Restrictions Weight Bearing Restrictions: No    Mobility  Bed Mobility Overal bed mobility: Needs Assistance Bed Mobility: Supine to Sit     Supine to sit: Min guard     General bed mobility comments: min guard for safety; increased time and effort   Transfers Overall transfer level: Needs assistance Equipment used: Rolling walker (2 wheeled) Transfers: Sit to/from Stand Sit to Stand: Min guard         General transfer comment: cues for safe hand placement; min guard for safety  Ambulation/Gait Ambulation/Gait assistance: Min assist;Min guard Ambulation Distance (Feet): 120 Feet Assistive device: Rolling walker (2 wheeled) Gait Pattern/deviations: Step-through pattern;Decreased step length - right;Decreased step length - left;Trunk flexed;Narrow base of support Gait velocity: decreased   General Gait Details: cues for posture and increased bilat step lengths   Stairs             Wheelchair Mobility    Modified Rankin (Stroke Patients Only) Modified Rankin (Stroke Patients  Only) Pre-Morbid Rankin Score: No significant disability Modified Rankin: Moderately severe disability     Balance Overall balance assessment: Needs assistance Sitting-balance support: Single extremity supported;Feet supported Sitting balance-Leahy Scale: Fair     Standing balance support: Bilateral upper extremity supported Standing balance-Leahy Scale: Poor                              Cognition Arousal/Alertness: Awake/alert Behavior During Therapy: WFL for tasks assessed/performed Overall Cognitive Status: Within Functional Limits for tasks assessed                                        Exercises      General Comments        Pertinent Vitals/Pain Pain Assessment: No/denies pain    Home Living                      Prior Function            PT Goals (current goals can now be found in the care plan section) Acute Rehab PT Goals Patient Stated Goal: back to normal and go home PT Goal Formulation: With patient Time For Goal Achievement: 04/25/18 Potential to Achieve Goals: Good Progress towards PT goals: Progressing toward goals    Frequency    Min 4X/week      PT Plan Current plan remains appropriate    Co-evaluation              AM-PAC PT "6 Clicks" Daily  Activity  Outcome Measure  Difficulty turning over in bed (including adjusting bedclothes, sheets and blankets)?: A Little Difficulty moving from lying on back to sitting on the side of the bed? : Unable Difficulty sitting down on and standing up from a chair with arms (e.g., wheelchair, bedside commode, etc,.)?: Unable Help needed moving to and from a bed to chair (including a wheelchair)?: A Little Help needed walking in hospital room?: A Little Help needed climbing 3-5 steps with a railing? : A Lot 6 Click Score: 13    End of Session Equipment Utilized During Treatment: Gait belt Activity Tolerance: Patient tolerated treatment well Patient left:  in chair;with call bell/phone within reach Nurse Communication: Mobility status PT Visit Diagnosis: Unsteadiness on feet (R26.81);Other abnormalities of gait and mobility (R26.89);Difficulty in walking, not elsewhere classified (R26.2);Muscle weakness (generalized) (M62.81)     Time: [id number] PT Time Calculation (min) (ACUTE ONLY): 19 min  Charges:  $Gait Training: 8-22 mins                    G Codes:       Erline LevineKellyn Madysin Crisp, PTA Pager: [phone]) [phone]     Carolynne EdouardKellyn R Leva Baine 04/15/2018, 1:39 PM

## 2018-04-15 NOTE — Progress Notes (Signed)
Occupational Therapy Treatment Patient Details Name: Roger Sanders MRN: [id number] DOB: March 02, 1946 Today's Date: 04/15/2018    History of present illness Patient is a 72 y/o male admitted on 04/10/18 for neurosurgical evaluation due to general weakness, tremors, and gait difficulties. MRI revealing spinal stenosis at C3-4. Patient undergoing continued work-up. PMH significant for EOTH abuse, recent carpal tunnel surgery, prior back surgeries.    OT comments  Skilled OT intervention with focus on theraputty HEP with use of paper handout. Pt able to demonstrate each exercise with L hand with increased time. No cuing needed to correct pt's technique. Pt did need to take multiple rest breaks secondary to muscle fatigue. Call bell and all needed items within reach upon exiting the room.   Follow Up Recommendations  Supervision/Assistance - 24 hour;Home health OT    Equipment Recommendations  None recommended by OT    Recommendations for Other Services      Precautions / Restrictions Precautions Precautions: Fall Restrictions Weight Bearing Restrictions: No       Mobility Bed Mobility Overal bed mobility: Needs Assistance Bed Mobility: Supine to Sit     Supine to sit: Min guard     General bed mobility comments: seated in chair upon entering the room  Transfers Overall transfer level: Needs assistance Equipment used: Rolling walker (2 wheeled) Transfers: Sit to/from Stand Sit to Stand: Min guard         General transfer comment: cues for safe hand placement; min guard for safety    Balance Overall balance assessment: Needs assistance Sitting-balance support: Single extremity supported;Feet supported Sitting balance-Leahy Scale: Fair     Standing balance support: Bilateral upper extremity supported Standing balance-Leahy Scale: Poor       ADL either performed or assessed with clinical judgement        Vision Patient Visual Report: No change from baseline Vision  Assessment?: No apparent visual deficits          Cognition Arousal/Alertness: Awake/alert Behavior During Therapy: WFL for tasks assessed/performed Overall Cognitive Status: Within Functional Limits for tasks assessed                      Pertinent Vitals/ Pain       Pain Assessment: No/denies pain         Frequency  Min 2X/week        Progress Toward Goals  OT Goals(current goals can now be found in the care plan section)  Progress towards OT goals: Progressing toward goals  Acute Rehab OT Goals Patient Stated Goal: back to normal and go home OT Goal Formulation: With patient Time For Goal Achievement: 04/29/18 Potential to Achieve Goals: Good  Plan Discharge plan needs to be updated       AM-PAC PT "6 Clicks" Daily Activity     Outcome Measure   Help from another person eating meals?: A Little Help from another person taking care of personal grooming?: A Little Help from another person toileting, which includes using toliet, bedpan, or urinal?: A Little Help from another person bathing (including washing, rinsing, drying)?: A Little Help from another person to put on and taking off regular upper body clothing?: A Little Help from another person to put on and taking off regular lower body clothing?: A Lot 6 Click Score: 17    End of Session Equipment Utilized During Treatment: Rolling walker  OT Visit Diagnosis: Other abnormalities of gait and mobility (R26.89);Muscle weakness (generalized) (M62.81)   Activity Tolerance Patient tolerated  treatment well   Patient Left in chair;with call bell/phone within reach;with chair alarm set   Nurse Communication          Time: [phone] OT Time Calculation (min): 21 min  Charges: OT General Charges $OT Visit: 1 Visit OT Treatments $Therapeutic Activity: 8-22 mins   Emie Sommerfeld P, MS, OTR/L 04/15/2018, 2:38 PM

## 2018-04-15 NOTE — Progress Notes (Signed)
PROGRESS NOTE    Roger Sanders  ZOX:[id number] DOB: 1946-01-09 DOA: 04/10/2018 PCP: Default, Provider, MD    Brief Narrative:  Roger Sanders is a 72 y.o. male with medical history significant of recent carpal tunnel surgery, previous back surgery transferred here from [facility] for neurosurgical evaluation.  Patient presented there with generalized weakness without any focal neurological deficits and had a stroke work-up.  He had a MRI of his C spine which showed C3-C4 possible  mass-effect on cord and swelling.  Dr. Franky Macho with neurosurgery was called who recommended patient being transferred here for neurosurgical evaluation. A repeat MRI of the cervical spine ordered. It showed Severe canal stenosis C3-4 with focal cord edema. No syrinx. Severe suspected bilateral C3-4 neural foraminal narrowing. Neurosurgery suggested working with PT as his symptoms are improving.       Assessment & Plan:   Principal Problem:   Spine anomaly Active Problems:   Generalized weakness   Spinal cord anomaly (HCC)   Severe spinal canal stenosis at C3 AND C4 with focal edema. : Further management as per neuro surgery . Pt's symptoms are improving but he reports his left arm strength is still at 3/5. He is working with PT and recommendations for SNF on discharge.  Pain control Continue with therapy for now.  The left upper arm strength is improving. Awaiting neuro surgery recommendations prior to discharge.    Mild normocytic anemia:  Hemoglobin stable    Hyponatremia:  Sodium is 134 and stable.  tsh wnl.     DVT prophylaxis: (SCD'S Code Status: full code.  Family Communication: none at bedside Disposition Plan: though recommendations are for SNF, pt wants to go home with home PT.    Consultants:   Neuro surgery.   Procedures: MRI cervical spine.  MRI brain.   Antimicrobials:none.   Subjective: No new complaints. No chest pain or sob.   Objective: Vitals:   04/15/18 0020  04/15/18 0413 04/15/18 0951 04/15/18 1208  BP: (!) 93/41 117/61 (!) 93/56 (!) 121/59  Pulse: 83 76 84 (!) 102  Resp: 16 18 16 16   Temp: 98.5 F (36.9 C) 98 F (36.7 C) 99.1 F (37.3 C) 98.6 F (37 C)  TempSrc: Oral Oral Oral Oral  SpO2: 96% 99% 97% 96%    Intake/Output Summary (Last 24 hours) at 04/15/2018 1653 Last data filed at 04/15/2018 0946 Gross per 24 hour  Intake 660 ml  Output 200 ml  Net 460 ml   There were no vitals filed for this visit.  Examination: no change in exam compared to yesterday.  General exam: calm and in good spirits.  Respiratory system: air entry fair, no wheezing or rhonchi.  Cardiovascular system: S1 & S2 heard, RRR. No JVD, murmurs,. No pedal edema. Gastrointestinal system: Abdomen is  Soft  Bowel sounds good.  Central nervous system: Alert and oriented.able to move all extremities. Marland Kitchenleft arm strength is 3/5 Extremities: no pedal edema, cyanosis. Left arm strength is around 3/5.  Skin: No rashes, lesions or ulcers Psychiatry:Mood & affect appropriate.     Data Reviewed: I have personally reviewed following labs and imaging studies  CBC: Recent Labs  Lab 04/10/18 1919 04/14/18 0631  WBC 5.1 5.8  NEUTROABS 2.1  --   HGB 12.4* 14.2  HCT 36.8* 40.9  MCV 97.6 94.7  PLT 345 390   Basic Metabolic Panel: Recent Labs  Lab 04/10/18 1919 04/11/18 0744 04/12/18 0938 04/14/18 0631  NA 132* 130* 130* 134*  K 4.1 4.3  4.4 4.3  CL 103 99* 97* 100*  CO2 21* 23 25 27   GLUCOSE 94 94 105* 102*  BUN 8 7 11 15   CREATININE 0.55* 0.56* 0.49* 0.42*  CALCIUM 8.8* 9.0 9.3 9.4  MG 1.7  --   --   --    GFR: CrCl cannot be calculated (Unknown ideal weight.). Liver Function Tests: Recent Labs  Lab 04/10/18 1919  AST 24  ALT 16*  ALKPHOS 55  BILITOT 0.5  PROT 6.5  ALBUMIN 3.5   No results for input(s): LIPASE, AMYLASE in the last 168 hours. No results for input(s): AMMONIA in the last 168 hours. Coagulation Profile: No results for input(s):  INR, PROTIME in the last 168 hours. Cardiac Enzymes: No results for input(s): CKTOTAL, CKMB, CKMBINDEX, TROPONINI in the last 168 hours. BNP (last 3 results) No results for input(s): PROBNP in the last 8760 hours. HbA1C: No results for input(s): HGBA1C in the last 72 hours. CBG: No results for input(s): GLUCAP in the last 168 hours. Lipid Profile: No results for input(s): CHOL, HDL, LDLCALC, TRIG, CHOLHDL, LDLDIRECT in the last 72 hours. Thyroid Function Tests: No results for input(s): TSH, T4TOTAL, FREET4, T3FREE, THYROIDAB in the last 72 hours. Anemia Panel: No results for input(s): VITAMINB12, FOLATE, FERRITIN, TIBC, IRON, RETICCTPCT in the last 72 hours. Sepsis Labs: No results for input(s): PROCALCITON, LATICACIDVEN in the last 168 hours.  No results found for this or any previous visit (from the past 240 hour(s)).       Radiology Studies: No results found.      Scheduled Meds: . folic acid  1 mg Oral Daily  . multivitamin with minerals  1 tablet Oral Daily  . nicotine  21 mg Transdermal Daily  . senna-docusate  2 tablet Oral BID  . sodium chloride flush  3 mL Intravenous Q12H  . thiamine  100 mg Oral Daily   Or  . thiamine  100 mg Intravenous Daily   Continuous Infusions: . sodium chloride Stopped (04/14/18 1600)     LOS: 4 days    Time spent: 15 minutes.     Kathlen ModyVijaya Cherre Kothari, MD [facility] Pager [phone]   If 7PM-7AM, please contact night-coverage [email or web address] Password [email or web address] 04/15/2018, 4:53 PM

## 2018-04-15 NOTE — Consult Note (Signed)
[facility] CM Primary Care Navigator  04/15/2018  Roger Sanders 06-Apr-1946 [id number]   Met with patientat the bedsideto identify possible discharge needs. [facility]", was transferred to [facility] for neurosurgical evaluation which hadledto this admission.(severe canal stenosis C3-4 with focal cord edema, severe suspected bilateral C3-4 neural foraminal narrowing)  Patient confirmed Roger Sanders, [place] with [facility] ashisprimary care provider.   [facility] [place].  Patientstates that hehas beenmanaginghisown medications at home with use of "pill box" system filled every week.  Patientreports thathis friend Roger Sanders) has been providing transportation to hisdoctors'appointments and another friend (Roger Sanders) will be able to provide transportation when he comes and stay with patient.  Made aware of [employer] transportation benefits when needed.   Patientlives alone, but his friend (Roger Sanders) will be moving in with him to assist with his care at home after discharge. His brother Roger Sanders- from [place]) will be able to provide assistance if needed as well.  Anticipateddischarge planishomeper patient, with home health services per therapy recommendation.  Patient voiced understanding to call primary care provider's officewhen hereturnshomefor a post discharge follow-upvisitwithin1- 2weeksor sooner if needs arise.Patient letter (with PCP's contact number) was provided asareminder. Patient verbalized that he will be calling primary care provider's office to request scheduled appointment for 6/29 be changed to an earlier date.  Discussed withpatientregarding [facility] CM services available for health management at home buthedeniesany concerns or needs at this time. He mentioned having  history of COPD which is being managed with inhaler and nebulizer. COPD action plan was discussed with teach back method. He states not feeling the need for any services for now and having no pressing issues from it.Roger Sanders seekreferral from primary care provider to [facility] care management if deemed necessaryand appropriate for anyservices in the future.  [facility] care management information was provided for future needs thathemay have.  Patienthowever, hadverbally agreedand optedforEMMIcalls tofollow- upwith hisrecoveryat home.   Referral made for [facility] calls after discharge.   For additional questions please contact:  Roger Sanders, BSN, RN-BC [facility] PRIMARY CARE Navigator Cell: [phone]

## 2018-04-15 NOTE — Care Management Important Message (Signed)
Important Message  Patient Details  Name: Roger RilingJames Sanders MRN: [id number] Date of Birth: 05/10/1946 Due to illness patient not able to sign.  Medicare Important Message Given:  No    Shaniquia Brafford 04/15/2018, 1:50 PM

## 2018-04-15 NOTE — Progress Notes (Signed)
Patient discharged home. Discharged instructions were reviewed with the patient. Patient Verbalized understanding.

## 2018-04-21 NOTE — Discharge Summary (Signed)
Physician Discharge Summary  Roger Sanders ZOX:[id number] DOB: 1946/04/13 DOA: 04/10/2018  PCP: Eunice Blase, PA-C  Admit date: 04/10/2018 Discharge date: 04/15/2018  Admitted From: Home Disposition:  Home  Recommendations for Outpatient Follow-up:  1. Follow up with PCP in 1-2 weeks 2. Please obtain BMP/CBC in one week 3. Please follow up with neuro surgery as recommended  Home Health:yes  Discharge Condition stable CODE STATUS: full code Diet recommendation: Heart Healthy  Brief/Interim Summary:  Roger Sanders a 72 y.o.malewith medical history significant ofrecent carpal tunnel surgery, previous back surgery transferred here from [facility] for neurosurgical evaluation. Patient presented there with generalized weakness without any focal neurological deficits and had a stroke work-up. He had a MRI of his C spine which showed C3-C4 possible mass-effect on cord and swelling. Dr. Franky Macho with neurosurgery was called who recommended patient being transferred here for neurosurgical evaluation. A repeat MRI of the cervical spine ordered. It showed Severe canal stenosis C3-4 with focal cord edema. No syrinx. Severe suspected bilateral C3-4 neural foraminal narrowing. Neurosurgery suggested working with PT as his symptoms are improving.     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 :   Further management as per neuro surgery . Pt's symptoms are improving but he reports his left arm strength is still at 3/5. He is working with PT and recommendations for SNF on discharge.  But patient wants to go home and follow up with neuro surgery as outpatient.    Mild normocytic anemia; Hemoglobin stable.    Hyponatremia: Improved to 134.    Discharge Instructions  Discharge Instructions    Diet - low sodium heart healthy   Complete by:   As directed    Discharge instructions   Complete by:  As directed    Follow up with PCP in one week,  Please follow up with home health PT/OT     Allergies as of 04/15/2018   No Known Allergies     Medication List    STOP taking these medications   acetaminophen-codeine 300-30 MG tablet Commonly known as:  TYLENOL #3   meloxicam 15 MG tablet Commonly known as:  MOBIC   sulfamethoxazole-trimethoprim 800-160 MG tablet Commonly known as:  BACTRIM DS,SEPTRA DS     TAKE these medications   acetaminophen 325 MG tablet Commonly known as:  TYLENOL Take 650 mg by mouth every 6 (six) hours as needed for mild pain, moderate pain, fever or headache.   albuterol 108 (90 Base) MCG/ACT inhaler Commonly known as:  PROVENTIL HFA;VENTOLIN HFA Inhale 2 puffs into the lungs every 4 (four) hours as needed for wheezing or shortness of breath.   albuterol (2.5 MG/3ML) 0.083% nebulizer solution Commonly known as:  PROVENTIL Take 2.5 mg by nebulization every 6 (six) hours as needed for wheezing or shortness of breath.   ALPRAZolam 0.5 MG tablet Commonly known as:  XANAX Take 0.5 mg by mouth See admin instructions. Take 1 tablet two or three times daily  as needed for anxiety   busPIRone 10 MG tablet Commonly known as:  BUSPAR Take 10 mg by mouth 2 (two) times daily as needed (anxiety).   diphenhydrAMINE 25 MG tablet Commonly known as:  BENADRYL Take 25 mg by mouth every 6 (six) hours as needed for allergies.   folic acid 1 MG tablet Commonly known as:  FOLVITE Take 1 tablet (  1 mg total) by mouth daily.   gabapentin 300 MG capsule Commonly known as:  NEURONTIN Take 300 mg by mouth 3 (three) times daily as needed (pain).   hydroxypropyl methylcellulose / hypromellose 2.5 % ophthalmic solution Commonly known as:  ISOPTO TEARS / GONIOVISC Place 1 drop into both eyes as needed for dry eyes.   multivitamin with minerals Tabs tablet Take 1 tablet by mouth daily.   nicotine 21 mg/24hr  patch Commonly known as:  NICODERM CQ - dosed in mg/24 hours Place 21 mg onto the skin daily.   thiamine 100 MG tablet Take 1 tablet (100 mg total) by mouth daily.      Follow-up Information    [facility] Follow up.   Specialty:  Internal Medicine Why:  Schedule f/u appointment within 2 weeks of d/c for hospital Contact information: [address] [place] [address] [phone]        [facility] Follow up.   Specialty:  Home Health Services Why:  they will contact you for the first appointment Contact information: [address] [place] [address] [phone]          No Known Allergies  Consultations: neurosurgery   Procedures/Studies: Mr Brain Wo Contrast  Result Date: 04/11/2018 CLINICAL DATA:  Unable to ambulate. Movement disorder. History of spinal cord anomaly, falls with alcoholism. Assess cervical spine stenosis. EXAM: MRI HEAD WITHOUT CONTRAST MRI CERVICAL SPINE WITHOUT CONTRAST TECHNIQUE: Multiplanar, multiecho pulse sequences of the brain and surrounding structures, and cervical spine, to include the craniocervical junction and cervicothoracic junction, were obtained without intravenous contrast. COMPARISON:  MRI head and cervical spine April 10, 2018. FINDINGS: Moderate motion degraded examination, worse than yesterday's examination. MRI HEAD FINDINGS INTRACRANIAL CONTENTS: No reduced diffusion to suggest acute ischemia. No susceptibility artifact to suggest hemorrhage. The ventricles and sulci are normal for patient's age. A few scattered subcentimeter supratentorial white matter FLAIR T2 hyperintensities compatible with mild chronic small vessel ischemic changes. No suspicious parenchymal signal, masses, mass effect. No abnormal extra-axial fluid collections. No extra-axial masses. VASCULAR: Normal major intracranial vascular flow voids present at skull base. SKULL AND UPPER CERVICAL SPINE: No abnormal sellar expansion. No  suspicious calvarial bone marrow signal. Craniocervical junction maintained. SINUSES/ORBITS: Trace paranasal sinus mucosal thickening.The included ocular globes and orbital contents are non-suspicious. OTHER: Patient is edentulous. Moderate to severely motion degraded examination, worse than yesterday's examination. MRI CERVICAL SPINE FINDINGS ALIGNMENT: Maintained cervical lordosis. Minimal C3-4 retrolisthesis. VERTEBRAE/DISCS: Vertebral bodies are intact. Intervertebral disc morphology's maintained with mild desiccation. Multilevel mild chronic discogenic endplate changes are without convincing evidence of acute or abnormal bone marrow signal abnormality. CORD:Cervical spinal cord compression at C3-4 with mild by a cord edema measuring approximately 1 cm. No syrinx or myelomalacia. POSTERIOR FOSSA, VERTEBRAL ARTERIES, PARASPINAL TISSUES: Limited assessment for ligamentous injury due to motion without definite paraspinal or ligamentous STIR signal abnormality. Vertebral artery flow voids generally maintained. DISC LEVELS: Severe canal stenosis at C3-4, 5 mm in AP dimension. No additional levels of severe canal stenosis. Severe suspected bilateral C3-4 neural foraminal narrowing. IMPRESSION: MRI head: 1. Moderately motion degraded examination, worse than yesterday's MRI head. 2. No acute intracranial process; negative noncontrast MRI head. MRI cervical spine: 1. Moderate to severely motion degraded examination, worse than yesterday's MRI cervical spine. 2. Severe canal stenosis C3-4 with focal cord edema. No syrinx. Severe suspected bilateral C3-4 neural foraminal narrowing. 3. Given inability to tolerate MRI, CT cervical spine may be of added value  or, re-attempt MRI with sedation. Electronically Signed   By: Awilda Metro M.D.   On: 04/11/2018 15:20   Mr Cervical Spine Wo Contrast  Result Date: 04/11/2018 CLINICAL DATA:  Unable to ambulate. Movement disorder. History of spinal cord anomaly, falls with  alcoholism. Assess cervical spine stenosis. EXAM: MRI HEAD WITHOUT CONTRAST MRI CERVICAL SPINE WITHOUT CONTRAST TECHNIQUE: Multiplanar, multiecho pulse sequences of the brain and surrounding structures, and cervical spine, to include the craniocervical junction and cervicothoracic junction, were obtained without intravenous contrast. COMPARISON:  MRI head and cervical spine April 10, 2018. FINDINGS: Moderate motion degraded examination, worse than yesterday's examination. MRI HEAD FINDINGS INTRACRANIAL CONTENTS: No reduced diffusion to suggest acute ischemia. No susceptibility artifact to suggest hemorrhage. The ventricles and sulci are normal for patient's age. A few scattered subcentimeter supratentorial white matter FLAIR T2 hyperintensities compatible with mild chronic small vessel ischemic changes. No suspicious parenchymal signal, masses, mass effect. No abnormal extra-axial fluid collections. No extra-axial masses. VASCULAR: Normal major intracranial vascular flow voids present at skull base. SKULL AND UPPER CERVICAL SPINE: No abnormal sellar expansion. No suspicious calvarial bone marrow signal. Craniocervical junction maintained. SINUSES/ORBITS: Trace paranasal sinus mucosal thickening.The included ocular globes and orbital contents are non-suspicious. OTHER: Patient is edentulous. Moderate to severely motion degraded examination, worse than yesterday's examination. MRI CERVICAL SPINE FINDINGS ALIGNMENT: Maintained cervical lordosis. Minimal C3-4 retrolisthesis. VERTEBRAE/DISCS: Vertebral bodies are intact. Intervertebral disc morphology's maintained with mild desiccation. Multilevel mild chronic discogenic endplate changes are without convincing evidence of acute or abnormal bone marrow signal abnormality. CORD:Cervical spinal cord compression at C3-4 with mild by a cord edema measuring approximately 1 cm. No syrinx or myelomalacia. POSTERIOR FOSSA, VERTEBRAL ARTERIES, PARASPINAL TISSUES: Limited assessment  for ligamentous injury due to motion without definite paraspinal or ligamentous STIR signal abnormality. Vertebral artery flow voids generally maintained. DISC LEVELS: Severe canal stenosis at C3-4, 5 mm in AP dimension. No additional levels of severe canal stenosis. Severe suspected bilateral C3-4 neural foraminal narrowing. IMPRESSION: MRI head: 1. Moderately motion degraded examination, worse than yesterday's MRI head. 2. No acute intracranial process; negative noncontrast MRI head. MRI cervical spine: 1. Moderate to severely motion degraded examination, worse than yesterday's MRI cervical spine. 2. Severe canal stenosis C3-4 with focal cord edema. No syrinx. Severe suspected bilateral C3-4 neural foraminal narrowing. 3. Given inability to tolerate MRI, CT cervical spine may be of added value or, re-attempt MRI with sedation. Electronically Signed   By: Awilda Metro M.D.   On: 04/11/2018 15:20       Subjective:refusing snf, wants to go home.    Discharge Exam: Vitals:   04/15/18 1208 04/15/18 1719  BP: (!) 121/59 123/65  Pulse: (!) 102 90  Resp: 16 16  Temp: 98.6 F (37 C) 98.8 F (37.1 C)  SpO2: 96% 99%   Vitals:   04/15/18 0413 04/15/18 0951 04/15/18 1208 04/15/18 1719  BP: 117/61 (!) 93/56 (!) 121/59 123/65  Pulse: 76 84 (!) 102 90  Resp: 18 16 16 16   Temp: 98 F (36.7 C) 99.1 F (37.3 C) 98.6 F (37 C) 98.8 F (37.1 C)  TempSrc: Oral Oral Oral Oral  SpO2: 99% 97% 96% 99%    General: Pt is alert, awake, not in acute distress Cardiovascular: RRR, S1/S2 +, no rubs, no gallops Respiratory: CTA bilaterally, no wheezing, no rhonchi Abdominal: Soft, NT, ND, bowel sounds + Extremities: no edema, no cyanosis    The results of significant diagnostics from this hospitalization (including imaging, microbiology, ancillary and  laboratory) are listed below for reference.     Microbiology: No results found for this or any previous visit (from the past 240 hour(s)).    Labs: BNP (last 3 results) No results for input(s): BNP in the last 8760 hours. Basic Metabolic Panel: Recent Labs  Lab 04/14/18 0631  NA 134*  K 4.3  CL 100*  CO2 27  GLUCOSE 102*  BUN 15  CREATININE 0.42*  CALCIUM 9.4   Liver Function Tests: No results for input(s): AST, ALT, ALKPHOS, BILITOT, PROT, ALBUMIN in the last 168 hours. No results for input(s): LIPASE, AMYLASE in the last 168 hours. No results for input(s): AMMONIA in the last 168 hours. CBC: Recent Labs  Lab 04/14/18 0631  WBC 5.8  HGB 14.2  HCT 40.9  MCV 94.7  PLT 390   Cardiac Enzymes: No results for input(s): CKTOTAL, CKMB, CKMBINDEX, TROPONINI in the last 168 hours. BNP: Invalid input(s): POCBNP CBG: No results for input(s): GLUCAP in the last 168 hours. D-Dimer No results for input(s): DDIMER in the last 72 hours. Hgb A1c No results for input(s): HGBA1C in the last 72 hours. Lipid Profile No results for input(s): CHOL, HDL, LDLCALC, TRIG, CHOLHDL, LDLDIRECT in the last 72 hours. Thyroid function studies No results for input(s): TSH, T4TOTAL, T3FREE, THYROIDAB in the last 72 hours.  Invalid input(s): FREET3 Anemia work up No results for input(s): VITAMINB12, FOLATE, FERRITIN, TIBC, IRON, RETICCTPCT in the last 72 hours. Urinalysis No results found for: COLORURINE, APPEARANCEUR, LABSPEC, PHURINE, GLUCOSEU, HGBUR, BILIRUBINUR, KETONESUR, PROTEINUR, UROBILINOGEN, NITRITE, LEUKOCYTESUR Sepsis Labs Invalid input(s): PROCALCITONIN,  WBC,  LACTICIDVEN Microbiology No results found for this or any previous visit (from the past 240 hour(s)).   Time coordinating discharge 35 minutes  SIGNED:   Kathlen ModyVijaya Denzel Etienne, MD  [facility] 04/21/2018, 2:12 AM Pager   If 7PM-7AM, please contact night-coverage [email or web address] Password [email or web address]

## 2018-04-24 DIAGNOSIS — G5600 Carpal tunnel syndrome, unspecified upper limb: Secondary | ICD-10-CM | POA: Diagnosis not present

## 2018-04-24 DIAGNOSIS — F419 Anxiety disorder, unspecified: Secondary | ICD-10-CM | POA: Diagnosis not present

## 2018-04-24 DIAGNOSIS — M4802 Spinal stenosis, cervical region: Secondary | ICD-10-CM | POA: Diagnosis not present

## 2018-04-24 DIAGNOSIS — Z6830 Body mass index (BMI) 30.0-30.9, adult: Secondary | ICD-10-CM | POA: Diagnosis not present

## 2018-04-24 DIAGNOSIS — Z1339 Encounter for screening examination for other mental health and behavioral disorders: Secondary | ICD-10-CM | POA: Diagnosis not present

## 2018-04-24 DIAGNOSIS — R29898 Other symptoms and signs involving the musculoskeletal system: Secondary | ICD-10-CM | POA: Diagnosis not present

## 2018-04-24 DIAGNOSIS — Z79899 Other long term (current) drug therapy: Secondary | ICD-10-CM | POA: Diagnosis not present

## 2018-05-02 DIAGNOSIS — F329 Major depressive disorder, single episode, unspecified: Secondary | ICD-10-CM | POA: Diagnosis not present

## 2018-05-02 DIAGNOSIS — Z602 Problems related to living alone: Secondary | ICD-10-CM | POA: Diagnosis not present

## 2018-05-02 DIAGNOSIS — Z9181 History of falling: Secondary | ICD-10-CM | POA: Diagnosis not present

## 2018-05-02 DIAGNOSIS — G5603 Carpal tunnel syndrome, bilateral upper limbs: Secondary | ICD-10-CM | POA: Diagnosis not present

## 2018-05-02 DIAGNOSIS — M1991 Primary osteoarthritis, unspecified site: Secondary | ICD-10-CM | POA: Diagnosis not present

## 2018-05-02 DIAGNOSIS — L03114 Cellulitis of left upper limb: Secondary | ICD-10-CM | POA: Diagnosis not present

## 2018-05-02 DIAGNOSIS — M109 Gout, unspecified: Secondary | ICD-10-CM | POA: Diagnosis not present

## 2018-05-02 DIAGNOSIS — Z48811 Encounter for surgical aftercare following surgery on the nervous system: Secondary | ICD-10-CM | POA: Diagnosis not present

## 2018-05-02 DIAGNOSIS — F419 Anxiety disorder, unspecified: Secondary | ICD-10-CM | POA: Diagnosis not present

## 2018-05-07 DIAGNOSIS — Z6820 Body mass index (BMI) 20.0-20.9, adult: Secondary | ICD-10-CM | POA: Diagnosis not present

## 2018-05-07 DIAGNOSIS — G5603 Carpal tunnel syndrome, bilateral upper limbs: Secondary | ICD-10-CM | POA: Diagnosis not present

## 2018-05-07 DIAGNOSIS — R609 Edema, unspecified: Secondary | ICD-10-CM | POA: Diagnosis not present

## 2018-05-07 DIAGNOSIS — M109 Gout, unspecified: Secondary | ICD-10-CM | POA: Diagnosis not present

## 2018-05-07 DIAGNOSIS — Z48811 Encounter for surgical aftercare following surgery on the nervous system: Secondary | ICD-10-CM | POA: Diagnosis not present

## 2018-05-07 DIAGNOSIS — F419 Anxiety disorder, unspecified: Secondary | ICD-10-CM | POA: Diagnosis not present

## 2018-05-07 DIAGNOSIS — Z9181 History of falling: Secondary | ICD-10-CM | POA: Diagnosis not present

## 2018-05-07 DIAGNOSIS — Z602 Problems related to living alone: Secondary | ICD-10-CM | POA: Diagnosis not present

## 2018-05-07 DIAGNOSIS — F329 Major depressive disorder, single episode, unspecified: Secondary | ICD-10-CM | POA: Diagnosis not present

## 2018-05-07 DIAGNOSIS — M1991 Primary osteoarthritis, unspecified site: Secondary | ICD-10-CM | POA: Diagnosis not present

## 2018-05-07 DIAGNOSIS — L03114 Cellulitis of left upper limb: Secondary | ICD-10-CM | POA: Diagnosis not present

## 2018-05-11 DIAGNOSIS — R42 Dizziness and giddiness: Secondary | ICD-10-CM | POA: Diagnosis not present

## 2018-05-11 DIAGNOSIS — M4802 Spinal stenosis, cervical region: Secondary | ICD-10-CM | POA: Diagnosis not present

## 2018-05-11 DIAGNOSIS — W1839XA Other fall on same level, initial encounter: Secondary | ICD-10-CM | POA: Diagnosis not present

## 2018-05-11 DIAGNOSIS — I10 Essential (primary) hypertension: Secondary | ICD-10-CM | POA: Diagnosis not present

## 2018-05-11 DIAGNOSIS — Y998 Other external cause status: Secondary | ICD-10-CM | POA: Diagnosis not present

## 2018-05-11 DIAGNOSIS — R5381 Other malaise: Secondary | ICD-10-CM | POA: Diagnosis not present

## 2018-05-11 DIAGNOSIS — R609 Edema, unspecified: Secondary | ICD-10-CM | POA: Diagnosis not present

## 2018-05-11 DIAGNOSIS — E876 Hypokalemia: Secondary | ICD-10-CM | POA: Diagnosis not present

## 2018-05-11 DIAGNOSIS — R2689 Other abnormalities of gait and mobility: Secondary | ICD-10-CM | POA: Diagnosis not present

## 2018-05-11 DIAGNOSIS — G5603 Carpal tunnel syndrome, bilateral upper limbs: Secondary | ICD-10-CM | POA: Diagnosis not present

## 2018-05-11 DIAGNOSIS — J449 Chronic obstructive pulmonary disease, unspecified: Secondary | ICD-10-CM | POA: Diagnosis not present

## 2018-05-11 DIAGNOSIS — R531 Weakness: Secondary | ICD-10-CM | POA: Diagnosis not present

## 2018-05-12 DIAGNOSIS — J449 Chronic obstructive pulmonary disease, unspecified: Secondary | ICD-10-CM | POA: Diagnosis not present

## 2018-05-12 DIAGNOSIS — G5603 Carpal tunnel syndrome, bilateral upper limbs: Secondary | ICD-10-CM | POA: Diagnosis not present

## 2018-05-12 DIAGNOSIS — E876 Hypokalemia: Secondary | ICD-10-CM | POA: Diagnosis not present

## 2018-05-12 DIAGNOSIS — R531 Weakness: Secondary | ICD-10-CM | POA: Diagnosis not present

## 2018-05-12 DIAGNOSIS — R5381 Other malaise: Secondary | ICD-10-CM | POA: Diagnosis not present

## 2018-05-12 DIAGNOSIS — R296 Repeated falls: Secondary | ICD-10-CM | POA: Diagnosis not present

## 2018-05-12 DIAGNOSIS — F419 Anxiety disorder, unspecified: Secondary | ICD-10-CM | POA: Diagnosis not present

## 2018-05-12 DIAGNOSIS — I10 Essential (primary) hypertension: Secondary | ICD-10-CM | POA: Diagnosis not present

## 2018-05-12 DIAGNOSIS — M4802 Spinal stenosis, cervical region: Secondary | ICD-10-CM | POA: Diagnosis not present

## 2018-05-13 DIAGNOSIS — M4802 Spinal stenosis, cervical region: Secondary | ICD-10-CM | POA: Diagnosis not present

## 2018-05-13 DIAGNOSIS — I10 Essential (primary) hypertension: Secondary | ICD-10-CM | POA: Diagnosis not present

## 2018-05-13 DIAGNOSIS — R296 Repeated falls: Secondary | ICD-10-CM | POA: Diagnosis not present

## 2018-05-13 DIAGNOSIS — F419 Anxiety disorder, unspecified: Secondary | ICD-10-CM | POA: Diagnosis not present

## 2018-05-13 DIAGNOSIS — G5603 Carpal tunnel syndrome, bilateral upper limbs: Secondary | ICD-10-CM | POA: Diagnosis not present

## 2018-05-13 DIAGNOSIS — R5381 Other malaise: Secondary | ICD-10-CM | POA: Diagnosis not present

## 2018-05-13 DIAGNOSIS — E876 Hypokalemia: Secondary | ICD-10-CM | POA: Diagnosis not present

## 2018-05-13 DIAGNOSIS — J449 Chronic obstructive pulmonary disease, unspecified: Secondary | ICD-10-CM | POA: Diagnosis not present

## 2018-05-14 DIAGNOSIS — M4802 Spinal stenosis, cervical region: Secondary | ICD-10-CM | POA: Diagnosis not present

## 2018-05-14 DIAGNOSIS — G5603 Carpal tunnel syndrome, bilateral upper limbs: Secondary | ICD-10-CM | POA: Diagnosis not present

## 2018-05-14 DIAGNOSIS — I10 Essential (primary) hypertension: Secondary | ICD-10-CM | POA: Diagnosis not present

## 2018-05-14 DIAGNOSIS — R079 Chest pain, unspecified: Secondary | ICD-10-CM | POA: Diagnosis not present

## 2018-05-14 DIAGNOSIS — Z8709 Personal history of other diseases of the respiratory system: Secondary | ICD-10-CM | POA: Diagnosis not present

## 2018-05-14 DIAGNOSIS — E876 Hypokalemia: Secondary | ICD-10-CM | POA: Diagnosis not present

## 2018-05-14 DIAGNOSIS — W19XXXA Unspecified fall, initial encounter: Secondary | ICD-10-CM | POA: Diagnosis not present

## 2018-05-14 DIAGNOSIS — J449 Chronic obstructive pulmonary disease, unspecified: Secondary | ICD-10-CM | POA: Diagnosis not present

## 2018-05-14 DIAGNOSIS — F419 Anxiety disorder, unspecified: Secondary | ICD-10-CM | POA: Diagnosis not present

## 2018-05-14 DIAGNOSIS — R531 Weakness: Secondary | ICD-10-CM | POA: Diagnosis not present

## 2018-05-15 DIAGNOSIS — R531 Weakness: Secondary | ICD-10-CM | POA: Diagnosis not present

## 2018-05-15 DIAGNOSIS — J449 Chronic obstructive pulmonary disease, unspecified: Secondary | ICD-10-CM | POA: Diagnosis not present

## 2018-05-15 DIAGNOSIS — W19XXXA Unspecified fall, initial encounter: Secondary | ICD-10-CM | POA: Diagnosis not present

## 2018-05-15 DIAGNOSIS — I10 Essential (primary) hypertension: Secondary | ICD-10-CM | POA: Diagnosis not present

## 2018-05-15 DIAGNOSIS — G5603 Carpal tunnel syndrome, bilateral upper limbs: Secondary | ICD-10-CM | POA: Diagnosis not present

## 2018-05-15 DIAGNOSIS — M4802 Spinal stenosis, cervical region: Secondary | ICD-10-CM | POA: Diagnosis not present

## 2018-05-15 DIAGNOSIS — E876 Hypokalemia: Secondary | ICD-10-CM | POA: Diagnosis not present

## 2018-05-15 DIAGNOSIS — F419 Anxiety disorder, unspecified: Secondary | ICD-10-CM | POA: Diagnosis not present

## 2018-05-15 DIAGNOSIS — Z8709 Personal history of other diseases of the respiratory system: Secondary | ICD-10-CM | POA: Diagnosis not present

## 2018-05-16 DIAGNOSIS — Z602 Problems related to living alone: Secondary | ICD-10-CM | POA: Diagnosis not present

## 2018-05-16 DIAGNOSIS — F419 Anxiety disorder, unspecified: Secondary | ICD-10-CM | POA: Diagnosis not present

## 2018-05-16 DIAGNOSIS — M109 Gout, unspecified: Secondary | ICD-10-CM | POA: Diagnosis not present

## 2018-05-16 DIAGNOSIS — L03114 Cellulitis of left upper limb: Secondary | ICD-10-CM | POA: Diagnosis not present

## 2018-05-16 DIAGNOSIS — F329 Major depressive disorder, single episode, unspecified: Secondary | ICD-10-CM | POA: Diagnosis not present

## 2018-05-16 DIAGNOSIS — M1991 Primary osteoarthritis, unspecified site: Secondary | ICD-10-CM | POA: Diagnosis not present

## 2018-05-16 DIAGNOSIS — G5603 Carpal tunnel syndrome, bilateral upper limbs: Secondary | ICD-10-CM | POA: Diagnosis not present

## 2018-05-16 DIAGNOSIS — Z9181 History of falling: Secondary | ICD-10-CM | POA: Diagnosis not present

## 2018-05-16 DIAGNOSIS — Z48811 Encounter for surgical aftercare following surgery on the nervous system: Secondary | ICD-10-CM | POA: Diagnosis not present

## 2018-05-21 DIAGNOSIS — Z48811 Encounter for surgical aftercare following surgery on the nervous system: Secondary | ICD-10-CM | POA: Diagnosis not present

## 2018-05-21 DIAGNOSIS — F329 Major depressive disorder, single episode, unspecified: Secondary | ICD-10-CM | POA: Diagnosis not present

## 2018-05-21 DIAGNOSIS — M1991 Primary osteoarthritis, unspecified site: Secondary | ICD-10-CM | POA: Diagnosis not present

## 2018-05-21 DIAGNOSIS — Z9181 History of falling: Secondary | ICD-10-CM | POA: Diagnosis not present

## 2018-05-21 DIAGNOSIS — Z602 Problems related to living alone: Secondary | ICD-10-CM | POA: Diagnosis not present

## 2018-05-21 DIAGNOSIS — F419 Anxiety disorder, unspecified: Secondary | ICD-10-CM | POA: Diagnosis not present

## 2018-05-21 DIAGNOSIS — G5603 Carpal tunnel syndrome, bilateral upper limbs: Secondary | ICD-10-CM | POA: Diagnosis not present

## 2018-05-21 DIAGNOSIS — M109 Gout, unspecified: Secondary | ICD-10-CM | POA: Diagnosis not present

## 2018-05-21 DIAGNOSIS — L03114 Cellulitis of left upper limb: Secondary | ICD-10-CM | POA: Diagnosis not present

## 2018-05-22 DIAGNOSIS — F419 Anxiety disorder, unspecified: Secondary | ICD-10-CM | POA: Diagnosis not present

## 2018-05-22 DIAGNOSIS — Z48811 Encounter for surgical aftercare following surgery on the nervous system: Secondary | ICD-10-CM | POA: Diagnosis not present

## 2018-05-22 DIAGNOSIS — Z9181 History of falling: Secondary | ICD-10-CM | POA: Diagnosis not present

## 2018-05-22 DIAGNOSIS — G5603 Carpal tunnel syndrome, bilateral upper limbs: Secondary | ICD-10-CM | POA: Diagnosis not present

## 2018-05-22 DIAGNOSIS — M109 Gout, unspecified: Secondary | ICD-10-CM | POA: Diagnosis not present

## 2018-05-22 DIAGNOSIS — Z602 Problems related to living alone: Secondary | ICD-10-CM | POA: Diagnosis not present

## 2018-05-22 DIAGNOSIS — L03114 Cellulitis of left upper limb: Secondary | ICD-10-CM | POA: Diagnosis not present

## 2018-05-22 DIAGNOSIS — M1991 Primary osteoarthritis, unspecified site: Secondary | ICD-10-CM | POA: Diagnosis not present

## 2018-05-22 DIAGNOSIS — F329 Major depressive disorder, single episode, unspecified: Secondary | ICD-10-CM | POA: Diagnosis not present

## 2018-05-27 DIAGNOSIS — L03114 Cellulitis of left upper limb: Secondary | ICD-10-CM | POA: Diagnosis not present

## 2018-05-27 DIAGNOSIS — Z602 Problems related to living alone: Secondary | ICD-10-CM | POA: Diagnosis not present

## 2018-05-27 DIAGNOSIS — F329 Major depressive disorder, single episode, unspecified: Secondary | ICD-10-CM | POA: Diagnosis not present

## 2018-05-27 DIAGNOSIS — Z9181 History of falling: Secondary | ICD-10-CM | POA: Diagnosis not present

## 2018-05-27 DIAGNOSIS — M109 Gout, unspecified: Secondary | ICD-10-CM | POA: Diagnosis not present

## 2018-05-27 DIAGNOSIS — Z48811 Encounter for surgical aftercare following surgery on the nervous system: Secondary | ICD-10-CM | POA: Diagnosis not present

## 2018-05-27 DIAGNOSIS — G5603 Carpal tunnel syndrome, bilateral upper limbs: Secondary | ICD-10-CM | POA: Diagnosis not present

## 2018-05-27 DIAGNOSIS — M1991 Primary osteoarthritis, unspecified site: Secondary | ICD-10-CM | POA: Diagnosis not present

## 2018-05-27 DIAGNOSIS — F419 Anxiety disorder, unspecified: Secondary | ICD-10-CM | POA: Diagnosis not present

## 2018-05-28 DIAGNOSIS — G5603 Carpal tunnel syndrome, bilateral upper limbs: Secondary | ICD-10-CM | POA: Diagnosis not present

## 2018-05-28 DIAGNOSIS — Z48811 Encounter for surgical aftercare following surgery on the nervous system: Secondary | ICD-10-CM | POA: Diagnosis not present

## 2018-05-28 DIAGNOSIS — L03114 Cellulitis of left upper limb: Secondary | ICD-10-CM | POA: Diagnosis not present

## 2018-05-28 DIAGNOSIS — M1991 Primary osteoarthritis, unspecified site: Secondary | ICD-10-CM | POA: Diagnosis not present

## 2018-05-28 DIAGNOSIS — F419 Anxiety disorder, unspecified: Secondary | ICD-10-CM | POA: Diagnosis not present

## 2018-05-28 DIAGNOSIS — M109 Gout, unspecified: Secondary | ICD-10-CM | POA: Diagnosis not present

## 2018-05-28 DIAGNOSIS — Z9181 History of falling: Secondary | ICD-10-CM | POA: Diagnosis not present

## 2018-05-28 DIAGNOSIS — Z602 Problems related to living alone: Secondary | ICD-10-CM | POA: Diagnosis not present

## 2018-05-28 DIAGNOSIS — F329 Major depressive disorder, single episode, unspecified: Secondary | ICD-10-CM | POA: Diagnosis not present

## 2018-05-29 DIAGNOSIS — L03114 Cellulitis of left upper limb: Secondary | ICD-10-CM | POA: Diagnosis not present

## 2018-05-29 DIAGNOSIS — Z602 Problems related to living alone: Secondary | ICD-10-CM | POA: Diagnosis not present

## 2018-05-29 DIAGNOSIS — F329 Major depressive disorder, single episode, unspecified: Secondary | ICD-10-CM | POA: Diagnosis not present

## 2018-05-29 DIAGNOSIS — Z9181 History of falling: Secondary | ICD-10-CM | POA: Diagnosis not present

## 2018-05-29 DIAGNOSIS — F419 Anxiety disorder, unspecified: Secondary | ICD-10-CM | POA: Diagnosis not present

## 2018-05-29 DIAGNOSIS — M1991 Primary osteoarthritis, unspecified site: Secondary | ICD-10-CM | POA: Diagnosis not present

## 2018-05-29 DIAGNOSIS — G5603 Carpal tunnel syndrome, bilateral upper limbs: Secondary | ICD-10-CM | POA: Diagnosis not present

## 2018-05-29 DIAGNOSIS — M109 Gout, unspecified: Secondary | ICD-10-CM | POA: Diagnosis not present

## 2018-05-29 DIAGNOSIS — Z48811 Encounter for surgical aftercare following surgery on the nervous system: Secondary | ICD-10-CM | POA: Diagnosis not present

## 2018-06-03 DIAGNOSIS — L03114 Cellulitis of left upper limb: Secondary | ICD-10-CM | POA: Diagnosis not present

## 2018-06-03 DIAGNOSIS — M109 Gout, unspecified: Secondary | ICD-10-CM | POA: Diagnosis not present

## 2018-06-03 DIAGNOSIS — F419 Anxiety disorder, unspecified: Secondary | ICD-10-CM | POA: Diagnosis not present

## 2018-06-03 DIAGNOSIS — G5603 Carpal tunnel syndrome, bilateral upper limbs: Secondary | ICD-10-CM | POA: Diagnosis not present

## 2018-06-03 DIAGNOSIS — Z602 Problems related to living alone: Secondary | ICD-10-CM | POA: Diagnosis not present

## 2018-06-03 DIAGNOSIS — M1991 Primary osteoarthritis, unspecified site: Secondary | ICD-10-CM | POA: Diagnosis not present

## 2018-06-03 DIAGNOSIS — Z48811 Encounter for surgical aftercare following surgery on the nervous system: Secondary | ICD-10-CM | POA: Diagnosis not present

## 2018-06-03 DIAGNOSIS — Z9181 History of falling: Secondary | ICD-10-CM | POA: Diagnosis not present

## 2018-06-03 DIAGNOSIS — F329 Major depressive disorder, single episode, unspecified: Secondary | ICD-10-CM | POA: Diagnosis not present

## 2018-06-04 DIAGNOSIS — Z9181 History of falling: Secondary | ICD-10-CM | POA: Diagnosis not present

## 2018-06-04 DIAGNOSIS — G5603 Carpal tunnel syndrome, bilateral upper limbs: Secondary | ICD-10-CM | POA: Diagnosis not present

## 2018-06-04 DIAGNOSIS — F419 Anxiety disorder, unspecified: Secondary | ICD-10-CM | POA: Diagnosis not present

## 2018-06-04 DIAGNOSIS — M4802 Spinal stenosis, cervical region: Secondary | ICD-10-CM | POA: Diagnosis not present

## 2018-06-04 DIAGNOSIS — M109 Gout, unspecified: Secondary | ICD-10-CM | POA: Diagnosis not present

## 2018-06-04 DIAGNOSIS — Z48811 Encounter for surgical aftercare following surgery on the nervous system: Secondary | ICD-10-CM | POA: Diagnosis not present

## 2018-06-04 DIAGNOSIS — M1991 Primary osteoarthritis, unspecified site: Secondary | ICD-10-CM | POA: Diagnosis not present

## 2018-06-04 DIAGNOSIS — F329 Major depressive disorder, single episode, unspecified: Secondary | ICD-10-CM | POA: Diagnosis not present

## 2018-06-04 DIAGNOSIS — Z602 Problems related to living alone: Secondary | ICD-10-CM | POA: Diagnosis not present

## 2018-06-04 DIAGNOSIS — M150 Primary generalized (osteo)arthritis: Secondary | ICD-10-CM | POA: Diagnosis not present

## 2018-06-07 DIAGNOSIS — Z48811 Encounter for surgical aftercare following surgery on the nervous system: Secondary | ICD-10-CM | POA: Diagnosis not present

## 2018-06-07 DIAGNOSIS — M109 Gout, unspecified: Secondary | ICD-10-CM | POA: Diagnosis not present

## 2018-06-07 DIAGNOSIS — Z602 Problems related to living alone: Secondary | ICD-10-CM | POA: Diagnosis not present

## 2018-06-07 DIAGNOSIS — F329 Major depressive disorder, single episode, unspecified: Secondary | ICD-10-CM | POA: Diagnosis not present

## 2018-06-07 DIAGNOSIS — G5603 Carpal tunnel syndrome, bilateral upper limbs: Secondary | ICD-10-CM | POA: Diagnosis not present

## 2018-06-07 DIAGNOSIS — M150 Primary generalized (osteo)arthritis: Secondary | ICD-10-CM | POA: Diagnosis not present

## 2018-06-07 DIAGNOSIS — M4802 Spinal stenosis, cervical region: Secondary | ICD-10-CM | POA: Diagnosis not present

## 2018-06-07 DIAGNOSIS — F419 Anxiety disorder, unspecified: Secondary | ICD-10-CM | POA: Diagnosis not present

## 2018-06-07 DIAGNOSIS — R609 Edema, unspecified: Secondary | ICD-10-CM | POA: Diagnosis not present

## 2018-06-07 DIAGNOSIS — Z9181 History of falling: Secondary | ICD-10-CM | POA: Diagnosis not present

## 2018-06-07 DIAGNOSIS — R29898 Other symptoms and signs involving the musculoskeletal system: Secondary | ICD-10-CM | POA: Diagnosis not present

## 2018-06-07 DIAGNOSIS — Z6820 Body mass index (BMI) 20.0-20.9, adult: Secondary | ICD-10-CM | POA: Diagnosis not present

## 2018-06-07 DIAGNOSIS — Z79899 Other long term (current) drug therapy: Secondary | ICD-10-CM | POA: Diagnosis not present

## 2018-06-10 DIAGNOSIS — M4802 Spinal stenosis, cervical region: Secondary | ICD-10-CM | POA: Diagnosis not present

## 2018-06-10 DIAGNOSIS — F419 Anxiety disorder, unspecified: Secondary | ICD-10-CM | POA: Diagnosis not present

## 2018-06-10 DIAGNOSIS — F329 Major depressive disorder, single episode, unspecified: Secondary | ICD-10-CM | POA: Diagnosis not present

## 2018-06-10 DIAGNOSIS — G5603 Carpal tunnel syndrome, bilateral upper limbs: Secondary | ICD-10-CM | POA: Diagnosis not present

## 2018-06-10 DIAGNOSIS — Z9181 History of falling: Secondary | ICD-10-CM | POA: Diagnosis not present

## 2018-06-10 DIAGNOSIS — M109 Gout, unspecified: Secondary | ICD-10-CM | POA: Diagnosis not present

## 2018-06-10 DIAGNOSIS — Z48811 Encounter for surgical aftercare following surgery on the nervous system: Secondary | ICD-10-CM | POA: Diagnosis not present

## 2018-06-10 DIAGNOSIS — M150 Primary generalized (osteo)arthritis: Secondary | ICD-10-CM | POA: Diagnosis not present

## 2018-06-10 DIAGNOSIS — Z602 Problems related to living alone: Secondary | ICD-10-CM | POA: Diagnosis not present

## 2018-06-11 DIAGNOSIS — M109 Gout, unspecified: Secondary | ICD-10-CM | POA: Diagnosis not present

## 2018-06-11 DIAGNOSIS — Z602 Problems related to living alone: Secondary | ICD-10-CM | POA: Diagnosis not present

## 2018-06-11 DIAGNOSIS — M150 Primary generalized (osteo)arthritis: Secondary | ICD-10-CM | POA: Diagnosis not present

## 2018-06-11 DIAGNOSIS — Z48811 Encounter for surgical aftercare following surgery on the nervous system: Secondary | ICD-10-CM | POA: Diagnosis not present

## 2018-06-11 DIAGNOSIS — F419 Anxiety disorder, unspecified: Secondary | ICD-10-CM | POA: Diagnosis not present

## 2018-06-11 DIAGNOSIS — G5603 Carpal tunnel syndrome, bilateral upper limbs: Secondary | ICD-10-CM | POA: Diagnosis not present

## 2018-06-11 DIAGNOSIS — F329 Major depressive disorder, single episode, unspecified: Secondary | ICD-10-CM | POA: Diagnosis not present

## 2018-06-11 DIAGNOSIS — Z9181 History of falling: Secondary | ICD-10-CM | POA: Diagnosis not present

## 2018-06-11 DIAGNOSIS — M4802 Spinal stenosis, cervical region: Secondary | ICD-10-CM | POA: Diagnosis not present

## 2018-06-14 DIAGNOSIS — F329 Major depressive disorder, single episode, unspecified: Secondary | ICD-10-CM | POA: Diagnosis not present

## 2018-06-14 DIAGNOSIS — F419 Anxiety disorder, unspecified: Secondary | ICD-10-CM | POA: Diagnosis not present

## 2018-06-14 DIAGNOSIS — Z602 Problems related to living alone: Secondary | ICD-10-CM | POA: Diagnosis not present

## 2018-06-14 DIAGNOSIS — M109 Gout, unspecified: Secondary | ICD-10-CM | POA: Diagnosis not present

## 2018-06-14 DIAGNOSIS — M150 Primary generalized (osteo)arthritis: Secondary | ICD-10-CM | POA: Diagnosis not present

## 2018-06-14 DIAGNOSIS — Z9181 History of falling: Secondary | ICD-10-CM | POA: Diagnosis not present

## 2018-06-14 DIAGNOSIS — G5603 Carpal tunnel syndrome, bilateral upper limbs: Secondary | ICD-10-CM | POA: Diagnosis not present

## 2018-06-14 DIAGNOSIS — M4802 Spinal stenosis, cervical region: Secondary | ICD-10-CM | POA: Diagnosis not present

## 2018-06-14 DIAGNOSIS — Z48811 Encounter for surgical aftercare following surgery on the nervous system: Secondary | ICD-10-CM | POA: Diagnosis not present

## 2018-06-18 DIAGNOSIS — F419 Anxiety disorder, unspecified: Secondary | ICD-10-CM | POA: Diagnosis not present

## 2018-06-18 DIAGNOSIS — G5603 Carpal tunnel syndrome, bilateral upper limbs: Secondary | ICD-10-CM | POA: Diagnosis not present

## 2018-06-18 DIAGNOSIS — M150 Primary generalized (osteo)arthritis: Secondary | ICD-10-CM | POA: Diagnosis not present

## 2018-06-18 DIAGNOSIS — F329 Major depressive disorder, single episode, unspecified: Secondary | ICD-10-CM | POA: Diagnosis not present

## 2018-06-18 DIAGNOSIS — Z9181 History of falling: Secondary | ICD-10-CM | POA: Diagnosis not present

## 2018-06-18 DIAGNOSIS — M4802 Spinal stenosis, cervical region: Secondary | ICD-10-CM | POA: Diagnosis not present

## 2018-06-18 DIAGNOSIS — Z602 Problems related to living alone: Secondary | ICD-10-CM | POA: Diagnosis not present

## 2018-06-18 DIAGNOSIS — Z48811 Encounter for surgical aftercare following surgery on the nervous system: Secondary | ICD-10-CM | POA: Diagnosis not present

## 2018-06-18 DIAGNOSIS — M109 Gout, unspecified: Secondary | ICD-10-CM | POA: Diagnosis not present

## 2018-06-20 DIAGNOSIS — Z6820 Body mass index (BMI) 20.0-20.9, adult: Secondary | ICD-10-CM | POA: Diagnosis not present

## 2018-06-20 DIAGNOSIS — S14129A Central cord syndrome at unspecified level of cervical spinal cord, initial encounter: Secondary | ICD-10-CM | POA: Diagnosis not present

## 2018-06-20 DIAGNOSIS — M4802 Spinal stenosis, cervical region: Secondary | ICD-10-CM | POA: Diagnosis not present

## 2018-06-20 DIAGNOSIS — M4722 Other spondylosis with radiculopathy, cervical region: Secondary | ICD-10-CM | POA: Diagnosis not present

## 2018-06-21 DIAGNOSIS — M4802 Spinal stenosis, cervical region: Secondary | ICD-10-CM | POA: Diagnosis not present

## 2018-06-21 DIAGNOSIS — M542 Cervicalgia: Secondary | ICD-10-CM | POA: Diagnosis not present

## 2018-06-21 DIAGNOSIS — R262 Difficulty in walking, not elsewhere classified: Secondary | ICD-10-CM | POA: Diagnosis not present

## 2018-06-21 DIAGNOSIS — M6281 Muscle weakness (generalized): Secondary | ICD-10-CM | POA: Diagnosis not present

## 2018-06-21 DIAGNOSIS — I10 Essential (primary) hypertension: Secondary | ICD-10-CM | POA: Diagnosis not present

## 2018-06-21 DIAGNOSIS — F17210 Nicotine dependence, cigarettes, uncomplicated: Secondary | ICD-10-CM | POA: Diagnosis not present

## 2018-06-21 DIAGNOSIS — R209 Unspecified disturbances of skin sensation: Secondary | ICD-10-CM | POA: Diagnosis not present

## 2018-06-21 DIAGNOSIS — S14125A Central cord syndrome at C5 level of cervical spinal cord, initial encounter: Secondary | ICD-10-CM | POA: Diagnosis not present

## 2018-06-21 DIAGNOSIS — M4712 Other spondylosis with myelopathy, cervical region: Secondary | ICD-10-CM | POA: Diagnosis not present

## 2018-06-21 DIAGNOSIS — S14129A Central cord syndrome at unspecified level of cervical spinal cord, initial encounter: Secondary | ICD-10-CM | POA: Diagnosis not present

## 2018-06-21 DIAGNOSIS — J449 Chronic obstructive pulmonary disease, unspecified: Secondary | ICD-10-CM | POA: Diagnosis not present

## 2018-06-21 DIAGNOSIS — M4322 Fusion of spine, cervical region: Secondary | ICD-10-CM | POA: Diagnosis not present

## 2018-06-21 DIAGNOSIS — M4722 Other spondylosis with radiculopathy, cervical region: Secondary | ICD-10-CM | POA: Diagnosis not present

## 2018-06-21 DIAGNOSIS — Z79899 Other long term (current) drug therapy: Secondary | ICD-10-CM | POA: Diagnosis not present

## 2018-06-24 DIAGNOSIS — M4802 Spinal stenosis, cervical region: Secondary | ICD-10-CM | POA: Diagnosis not present

## 2018-06-24 DIAGNOSIS — M4322 Fusion of spine, cervical region: Secondary | ICD-10-CM | POA: Diagnosis not present

## 2018-06-27 DIAGNOSIS — M4802 Spinal stenosis, cervical region: Secondary | ICD-10-CM | POA: Diagnosis not present

## 2018-06-27 DIAGNOSIS — G5603 Carpal tunnel syndrome, bilateral upper limbs: Secondary | ICD-10-CM | POA: Diagnosis not present

## 2018-06-27 DIAGNOSIS — Z48811 Encounter for surgical aftercare following surgery on the nervous system: Secondary | ICD-10-CM | POA: Diagnosis not present

## 2018-06-27 DIAGNOSIS — M150 Primary generalized (osteo)arthritis: Secondary | ICD-10-CM | POA: Diagnosis not present

## 2018-06-27 DIAGNOSIS — F329 Major depressive disorder, single episode, unspecified: Secondary | ICD-10-CM | POA: Diagnosis not present

## 2018-06-27 DIAGNOSIS — Z9181 History of falling: Secondary | ICD-10-CM | POA: Diagnosis not present

## 2018-06-27 DIAGNOSIS — F419 Anxiety disorder, unspecified: Secondary | ICD-10-CM | POA: Diagnosis not present

## 2018-06-27 DIAGNOSIS — M109 Gout, unspecified: Secondary | ICD-10-CM | POA: Diagnosis not present

## 2018-06-27 DIAGNOSIS — Z602 Problems related to living alone: Secondary | ICD-10-CM | POA: Diagnosis not present

## 2018-06-28 DIAGNOSIS — Z602 Problems related to living alone: Secondary | ICD-10-CM | POA: Diagnosis not present

## 2018-06-28 DIAGNOSIS — M4802 Spinal stenosis, cervical region: Secondary | ICD-10-CM | POA: Diagnosis not present

## 2018-06-28 DIAGNOSIS — M150 Primary generalized (osteo)arthritis: Secondary | ICD-10-CM | POA: Diagnosis not present

## 2018-06-28 DIAGNOSIS — F419 Anxiety disorder, unspecified: Secondary | ICD-10-CM | POA: Diagnosis not present

## 2018-06-28 DIAGNOSIS — Z9181 History of falling: Secondary | ICD-10-CM | POA: Diagnosis not present

## 2018-06-28 DIAGNOSIS — G5603 Carpal tunnel syndrome, bilateral upper limbs: Secondary | ICD-10-CM | POA: Diagnosis not present

## 2018-06-28 DIAGNOSIS — Z48811 Encounter for surgical aftercare following surgery on the nervous system: Secondary | ICD-10-CM | POA: Diagnosis not present

## 2018-06-28 DIAGNOSIS — F329 Major depressive disorder, single episode, unspecified: Secondary | ICD-10-CM | POA: Diagnosis not present

## 2018-06-28 DIAGNOSIS — M109 Gout, unspecified: Secondary | ICD-10-CM | POA: Diagnosis not present

## 2018-07-01 DIAGNOSIS — M150 Primary generalized (osteo)arthritis: Secondary | ICD-10-CM | POA: Diagnosis not present

## 2018-07-01 DIAGNOSIS — M109 Gout, unspecified: Secondary | ICD-10-CM | POA: Diagnosis not present

## 2018-07-01 DIAGNOSIS — F329 Major depressive disorder, single episode, unspecified: Secondary | ICD-10-CM | POA: Diagnosis not present

## 2018-07-01 DIAGNOSIS — G5603 Carpal tunnel syndrome, bilateral upper limbs: Secondary | ICD-10-CM | POA: Diagnosis not present

## 2018-07-01 DIAGNOSIS — Z602 Problems related to living alone: Secondary | ICD-10-CM | POA: Diagnosis not present

## 2018-07-01 DIAGNOSIS — Z9181 History of falling: Secondary | ICD-10-CM | POA: Diagnosis not present

## 2018-07-01 DIAGNOSIS — F419 Anxiety disorder, unspecified: Secondary | ICD-10-CM | POA: Diagnosis not present

## 2018-07-01 DIAGNOSIS — Z48811 Encounter for surgical aftercare following surgery on the nervous system: Secondary | ICD-10-CM | POA: Diagnosis not present

## 2018-07-01 DIAGNOSIS — M4802 Spinal stenosis, cervical region: Secondary | ICD-10-CM | POA: Diagnosis not present

## 2018-07-03 DIAGNOSIS — M109 Gout, unspecified: Secondary | ICD-10-CM | POA: Diagnosis not present

## 2018-07-03 DIAGNOSIS — Z602 Problems related to living alone: Secondary | ICD-10-CM | POA: Diagnosis not present

## 2018-07-03 DIAGNOSIS — M150 Primary generalized (osteo)arthritis: Secondary | ICD-10-CM | POA: Diagnosis not present

## 2018-07-03 DIAGNOSIS — M4802 Spinal stenosis, cervical region: Secondary | ICD-10-CM | POA: Diagnosis not present

## 2018-07-03 DIAGNOSIS — F329 Major depressive disorder, single episode, unspecified: Secondary | ICD-10-CM | POA: Diagnosis not present

## 2018-07-03 DIAGNOSIS — G5603 Carpal tunnel syndrome, bilateral upper limbs: Secondary | ICD-10-CM | POA: Diagnosis not present

## 2018-07-03 DIAGNOSIS — Z9181 History of falling: Secondary | ICD-10-CM | POA: Diagnosis not present

## 2018-07-03 DIAGNOSIS — Z48811 Encounter for surgical aftercare following surgery on the nervous system: Secondary | ICD-10-CM | POA: Diagnosis not present

## 2018-07-03 DIAGNOSIS — F419 Anxiety disorder, unspecified: Secondary | ICD-10-CM | POA: Diagnosis not present

## 2018-07-09 DIAGNOSIS — F329 Major depressive disorder, single episode, unspecified: Secondary | ICD-10-CM | POA: Diagnosis not present

## 2018-07-09 DIAGNOSIS — F419 Anxiety disorder, unspecified: Secondary | ICD-10-CM | POA: Diagnosis not present

## 2018-07-09 DIAGNOSIS — M109 Gout, unspecified: Secondary | ICD-10-CM | POA: Diagnosis not present

## 2018-07-09 DIAGNOSIS — Z602 Problems related to living alone: Secondary | ICD-10-CM | POA: Diagnosis not present

## 2018-07-09 DIAGNOSIS — M4802 Spinal stenosis, cervical region: Secondary | ICD-10-CM | POA: Diagnosis not present

## 2018-07-09 DIAGNOSIS — M150 Primary generalized (osteo)arthritis: Secondary | ICD-10-CM | POA: Diagnosis not present

## 2018-07-09 DIAGNOSIS — Z9181 History of falling: Secondary | ICD-10-CM | POA: Diagnosis not present

## 2018-07-09 DIAGNOSIS — G5603 Carpal tunnel syndrome, bilateral upper limbs: Secondary | ICD-10-CM | POA: Diagnosis not present

## 2018-07-09 DIAGNOSIS — Z48811 Encounter for surgical aftercare following surgery on the nervous system: Secondary | ICD-10-CM | POA: Diagnosis not present

## 2018-07-11 DIAGNOSIS — Z48811 Encounter for surgical aftercare following surgery on the nervous system: Secondary | ICD-10-CM | POA: Diagnosis not present

## 2018-07-11 DIAGNOSIS — Z602 Problems related to living alone: Secondary | ICD-10-CM | POA: Diagnosis not present

## 2018-07-11 DIAGNOSIS — M109 Gout, unspecified: Secondary | ICD-10-CM | POA: Diagnosis not present

## 2018-07-11 DIAGNOSIS — M4802 Spinal stenosis, cervical region: Secondary | ICD-10-CM | POA: Diagnosis not present

## 2018-07-11 DIAGNOSIS — G5603 Carpal tunnel syndrome, bilateral upper limbs: Secondary | ICD-10-CM | POA: Diagnosis not present

## 2018-07-11 DIAGNOSIS — Z9181 History of falling: Secondary | ICD-10-CM | POA: Diagnosis not present

## 2018-07-11 DIAGNOSIS — M150 Primary generalized (osteo)arthritis: Secondary | ICD-10-CM | POA: Diagnosis not present

## 2018-07-11 DIAGNOSIS — F419 Anxiety disorder, unspecified: Secondary | ICD-10-CM | POA: Diagnosis not present

## 2018-07-11 DIAGNOSIS — F329 Major depressive disorder, single episode, unspecified: Secondary | ICD-10-CM | POA: Diagnosis not present

## 2018-07-15 DIAGNOSIS — F419 Anxiety disorder, unspecified: Secondary | ICD-10-CM | POA: Diagnosis not present

## 2018-07-15 DIAGNOSIS — F329 Major depressive disorder, single episode, unspecified: Secondary | ICD-10-CM | POA: Diagnosis not present

## 2018-07-15 DIAGNOSIS — M4802 Spinal stenosis, cervical region: Secondary | ICD-10-CM | POA: Diagnosis not present

## 2018-07-15 DIAGNOSIS — M150 Primary generalized (osteo)arthritis: Secondary | ICD-10-CM | POA: Diagnosis not present

## 2018-07-15 DIAGNOSIS — Z9181 History of falling: Secondary | ICD-10-CM | POA: Diagnosis not present

## 2018-07-15 DIAGNOSIS — G5603 Carpal tunnel syndrome, bilateral upper limbs: Secondary | ICD-10-CM | POA: Diagnosis not present

## 2018-07-15 DIAGNOSIS — Z48811 Encounter for surgical aftercare following surgery on the nervous system: Secondary | ICD-10-CM | POA: Diagnosis not present

## 2018-07-15 DIAGNOSIS — Z602 Problems related to living alone: Secondary | ICD-10-CM | POA: Diagnosis not present

## 2018-07-15 DIAGNOSIS — M109 Gout, unspecified: Secondary | ICD-10-CM | POA: Diagnosis not present

## 2018-07-19 DIAGNOSIS — Z602 Problems related to living alone: Secondary | ICD-10-CM | POA: Diagnosis not present

## 2018-07-19 DIAGNOSIS — Z48811 Encounter for surgical aftercare following surgery on the nervous system: Secondary | ICD-10-CM | POA: Diagnosis not present

## 2018-07-19 DIAGNOSIS — M109 Gout, unspecified: Secondary | ICD-10-CM | POA: Diagnosis not present

## 2018-07-19 DIAGNOSIS — F419 Anxiety disorder, unspecified: Secondary | ICD-10-CM | POA: Diagnosis not present

## 2018-07-19 DIAGNOSIS — M150 Primary generalized (osteo)arthritis: Secondary | ICD-10-CM | POA: Diagnosis not present

## 2018-07-19 DIAGNOSIS — G5603 Carpal tunnel syndrome, bilateral upper limbs: Secondary | ICD-10-CM | POA: Diagnosis not present

## 2018-07-19 DIAGNOSIS — Z9181 History of falling: Secondary | ICD-10-CM | POA: Diagnosis not present

## 2018-07-19 DIAGNOSIS — M4802 Spinal stenosis, cervical region: Secondary | ICD-10-CM | POA: Diagnosis not present

## 2018-07-19 DIAGNOSIS — F329 Major depressive disorder, single episode, unspecified: Secondary | ICD-10-CM | POA: Diagnosis not present

## 2018-07-22 DIAGNOSIS — M109 Gout, unspecified: Secondary | ICD-10-CM | POA: Diagnosis not present

## 2018-07-22 DIAGNOSIS — M150 Primary generalized (osteo)arthritis: Secondary | ICD-10-CM | POA: Diagnosis not present

## 2018-07-22 DIAGNOSIS — Z9181 History of falling: Secondary | ICD-10-CM | POA: Diagnosis not present

## 2018-07-22 DIAGNOSIS — G5603 Carpal tunnel syndrome, bilateral upper limbs: Secondary | ICD-10-CM | POA: Diagnosis not present

## 2018-07-22 DIAGNOSIS — F329 Major depressive disorder, single episode, unspecified: Secondary | ICD-10-CM | POA: Diagnosis not present

## 2018-07-22 DIAGNOSIS — Z602 Problems related to living alone: Secondary | ICD-10-CM | POA: Diagnosis not present

## 2018-07-22 DIAGNOSIS — M4802 Spinal stenosis, cervical region: Secondary | ICD-10-CM | POA: Diagnosis not present

## 2018-07-22 DIAGNOSIS — F419 Anxiety disorder, unspecified: Secondary | ICD-10-CM | POA: Diagnosis not present

## 2018-07-22 DIAGNOSIS — Z48811 Encounter for surgical aftercare following surgery on the nervous system: Secondary | ICD-10-CM | POA: Diagnosis not present

## 2018-07-23 DIAGNOSIS — M4322 Fusion of spine, cervical region: Secondary | ICD-10-CM | POA: Diagnosis not present

## 2018-07-24 DIAGNOSIS — F329 Major depressive disorder, single episode, unspecified: Secondary | ICD-10-CM | POA: Diagnosis not present

## 2018-07-24 DIAGNOSIS — M150 Primary generalized (osteo)arthritis: Secondary | ICD-10-CM | POA: Diagnosis not present

## 2018-07-24 DIAGNOSIS — M109 Gout, unspecified: Secondary | ICD-10-CM | POA: Diagnosis not present

## 2018-07-24 DIAGNOSIS — M4802 Spinal stenosis, cervical region: Secondary | ICD-10-CM | POA: Diagnosis not present

## 2018-07-24 DIAGNOSIS — Z602 Problems related to living alone: Secondary | ICD-10-CM | POA: Diagnosis not present

## 2018-07-24 DIAGNOSIS — G5603 Carpal tunnel syndrome, bilateral upper limbs: Secondary | ICD-10-CM | POA: Diagnosis not present

## 2018-07-24 DIAGNOSIS — F419 Anxiety disorder, unspecified: Secondary | ICD-10-CM | POA: Diagnosis not present

## 2018-07-24 DIAGNOSIS — Z9181 History of falling: Secondary | ICD-10-CM | POA: Diagnosis not present

## 2018-07-24 DIAGNOSIS — Z48811 Encounter for surgical aftercare following surgery on the nervous system: Secondary | ICD-10-CM | POA: Diagnosis not present

## 2018-07-29 DIAGNOSIS — F419 Anxiety disorder, unspecified: Secondary | ICD-10-CM | POA: Diagnosis not present

## 2018-07-29 DIAGNOSIS — Z602 Problems related to living alone: Secondary | ICD-10-CM | POA: Diagnosis not present

## 2018-07-29 DIAGNOSIS — M4802 Spinal stenosis, cervical region: Secondary | ICD-10-CM | POA: Diagnosis not present

## 2018-07-29 DIAGNOSIS — G5603 Carpal tunnel syndrome, bilateral upper limbs: Secondary | ICD-10-CM | POA: Diagnosis not present

## 2018-07-29 DIAGNOSIS — M150 Primary generalized (osteo)arthritis: Secondary | ICD-10-CM | POA: Diagnosis not present

## 2018-07-29 DIAGNOSIS — Z48811 Encounter for surgical aftercare following surgery on the nervous system: Secondary | ICD-10-CM | POA: Diagnosis not present

## 2018-07-29 DIAGNOSIS — F329 Major depressive disorder, single episode, unspecified: Secondary | ICD-10-CM | POA: Diagnosis not present

## 2018-07-29 DIAGNOSIS — Z9181 History of falling: Secondary | ICD-10-CM | POA: Diagnosis not present

## 2018-07-29 DIAGNOSIS — M109 Gout, unspecified: Secondary | ICD-10-CM | POA: Diagnosis not present

## 2018-08-01 DIAGNOSIS — M150 Primary generalized (osteo)arthritis: Secondary | ICD-10-CM | POA: Diagnosis not present

## 2018-08-01 DIAGNOSIS — Z48811 Encounter for surgical aftercare following surgery on the nervous system: Secondary | ICD-10-CM | POA: Diagnosis not present

## 2018-08-01 DIAGNOSIS — Z9181 History of falling: Secondary | ICD-10-CM | POA: Diagnosis not present

## 2018-08-01 DIAGNOSIS — M109 Gout, unspecified: Secondary | ICD-10-CM | POA: Diagnosis not present

## 2018-08-01 DIAGNOSIS — G5603 Carpal tunnel syndrome, bilateral upper limbs: Secondary | ICD-10-CM | POA: Diagnosis not present

## 2018-08-01 DIAGNOSIS — M4802 Spinal stenosis, cervical region: Secondary | ICD-10-CM | POA: Diagnosis not present

## 2018-08-01 DIAGNOSIS — F419 Anxiety disorder, unspecified: Secondary | ICD-10-CM | POA: Diagnosis not present

## 2018-08-01 DIAGNOSIS — F329 Major depressive disorder, single episode, unspecified: Secondary | ICD-10-CM | POA: Diagnosis not present

## 2018-08-01 DIAGNOSIS — Z602 Problems related to living alone: Secondary | ICD-10-CM | POA: Diagnosis not present

## 2018-08-23 DIAGNOSIS — Z7409 Other reduced mobility: Secondary | ICD-10-CM | POA: Diagnosis not present

## 2018-08-23 DIAGNOSIS — S14129A Central cord syndrome at unspecified level of cervical spinal cord, initial encounter: Secondary | ICD-10-CM | POA: Diagnosis not present

## 2018-08-23 DIAGNOSIS — M4802 Spinal stenosis, cervical region: Secondary | ICD-10-CM | POA: Diagnosis not present

## 2018-08-28 DIAGNOSIS — Z6820 Body mass index (BMI) 20.0-20.9, adult: Secondary | ICD-10-CM | POA: Diagnosis not present

## 2018-08-28 DIAGNOSIS — Z23 Encounter for immunization: Secondary | ICD-10-CM | POA: Diagnosis not present

## 2018-08-28 DIAGNOSIS — R609 Edema, unspecified: Secondary | ICD-10-CM | POA: Diagnosis not present

## 2018-08-28 DIAGNOSIS — M109 Gout, unspecified: Secondary | ICD-10-CM | POA: Diagnosis not present

## 2018-09-09 DIAGNOSIS — Z6820 Body mass index (BMI) 20.0-20.9, adult: Secondary | ICD-10-CM | POA: Diagnosis not present

## 2018-09-09 DIAGNOSIS — Z79899 Other long term (current) drug therapy: Secondary | ICD-10-CM | POA: Diagnosis not present

## 2018-09-09 DIAGNOSIS — M109 Gout, unspecified: Secondary | ICD-10-CM | POA: Diagnosis not present

## 2018-09-09 DIAGNOSIS — M4802 Spinal stenosis, cervical region: Secondary | ICD-10-CM | POA: Diagnosis not present

## 2018-09-09 DIAGNOSIS — F419 Anxiety disorder, unspecified: Secondary | ICD-10-CM | POA: Diagnosis not present

## 2018-09-09 DIAGNOSIS — E785 Hyperlipidemia, unspecified: Secondary | ICD-10-CM | POA: Diagnosis not present

## 2018-09-24 DIAGNOSIS — S14129D Central cord syndrome at unspecified level of cervical spinal cord, subsequent encounter: Secondary | ICD-10-CM | POA: Diagnosis not present

## 2018-09-24 DIAGNOSIS — M4802 Spinal stenosis, cervical region: Secondary | ICD-10-CM | POA: Diagnosis not present

## 2018-09-24 DIAGNOSIS — M4712 Other spondylosis with myelopathy, cervical region: Secondary | ICD-10-CM | POA: Diagnosis not present

## 2018-09-24 DIAGNOSIS — M4722 Other spondylosis with radiculopathy, cervical region: Secondary | ICD-10-CM | POA: Diagnosis not present

## 2018-12-10 DIAGNOSIS — Z125 Encounter for screening for malignant neoplasm of prostate: Secondary | ICD-10-CM | POA: Diagnosis not present

## 2018-12-10 DIAGNOSIS — J449 Chronic obstructive pulmonary disease, unspecified: Secondary | ICD-10-CM | POA: Diagnosis not present

## 2018-12-10 DIAGNOSIS — M109 Gout, unspecified: Secondary | ICD-10-CM | POA: Diagnosis not present

## 2018-12-10 DIAGNOSIS — M4802 Spinal stenosis, cervical region: Secondary | ICD-10-CM | POA: Diagnosis not present

## 2018-12-10 DIAGNOSIS — Z87891 Personal history of nicotine dependence: Secondary | ICD-10-CM | POA: Diagnosis not present

## 2018-12-10 DIAGNOSIS — F419 Anxiety disorder, unspecified: Secondary | ICD-10-CM | POA: Diagnosis not present

## 2018-12-10 DIAGNOSIS — F339 Major depressive disorder, recurrent, unspecified: Secondary | ICD-10-CM | POA: Diagnosis not present

## 2018-12-10 DIAGNOSIS — Z79899 Other long term (current) drug therapy: Secondary | ICD-10-CM | POA: Diagnosis not present

## 2018-12-10 DIAGNOSIS — E785 Hyperlipidemia, unspecified: Secondary | ICD-10-CM | POA: Diagnosis not present

## 2018-12-24 DIAGNOSIS — M4712 Other spondylosis with myelopathy, cervical region: Secondary | ICD-10-CM | POA: Diagnosis not present

## 2018-12-24 DIAGNOSIS — S14129D Central cord syndrome at unspecified level of cervical spinal cord, subsequent encounter: Secondary | ICD-10-CM | POA: Diagnosis not present

## 2018-12-24 DIAGNOSIS — M4722 Other spondylosis with radiculopathy, cervical region: Secondary | ICD-10-CM | POA: Diagnosis not present

## 2018-12-24 DIAGNOSIS — M4802 Spinal stenosis, cervical region: Secondary | ICD-10-CM | POA: Diagnosis not present

## 2019-02-27 DIAGNOSIS — S14129D Central cord syndrome at unspecified level of cervical spinal cord, subsequent encounter: Secondary | ICD-10-CM | POA: Diagnosis not present

## 2019-02-27 DIAGNOSIS — M4722 Other spondylosis with radiculopathy, cervical region: Secondary | ICD-10-CM | POA: Diagnosis not present

## 2019-02-27 DIAGNOSIS — M4712 Other spondylosis with myelopathy, cervical region: Secondary | ICD-10-CM | POA: Diagnosis not present

## 2019-02-27 DIAGNOSIS — M4322 Fusion of spine, cervical region: Secondary | ICD-10-CM | POA: Diagnosis not present

## 2019-02-28 DIAGNOSIS — R456 Violent behavior: Secondary | ICD-10-CM | POA: Diagnosis not present

## 2019-02-28 DIAGNOSIS — R410 Disorientation, unspecified: Secondary | ICD-10-CM | POA: Diagnosis not present

## 2019-02-28 DIAGNOSIS — J449 Chronic obstructive pulmonary disease, unspecified: Secondary | ICD-10-CM | POA: Diagnosis not present

## 2019-02-28 DIAGNOSIS — G252 Other specified forms of tremor: Secondary | ICD-10-CM | POA: Diagnosis not present

## 2019-02-28 DIAGNOSIS — E871 Hypo-osmolality and hyponatremia: Secondary | ICD-10-CM | POA: Diagnosis not present

## 2019-02-28 DIAGNOSIS — Z8669 Personal history of other diseases of the nervous system and sense organs: Secondary | ICD-10-CM | POA: Diagnosis not present

## 2019-02-28 DIAGNOSIS — Z723 Lack of physical exercise: Secondary | ICD-10-CM | POA: Diagnosis not present

## 2019-02-28 DIAGNOSIS — R531 Weakness: Secondary | ICD-10-CM | POA: Diagnosis not present

## 2019-02-28 DIAGNOSIS — R404 Transient alteration of awareness: Secondary | ICD-10-CM | POA: Diagnosis not present

## 2019-02-28 DIAGNOSIS — R569 Unspecified convulsions: Secondary | ICD-10-CM | POA: Diagnosis not present

## 2019-02-28 DIAGNOSIS — G4089 Other seizures: Secondary | ICD-10-CM | POA: Diagnosis not present

## 2019-02-28 DIAGNOSIS — F1320 Sedative, hypnotic or anxiolytic dependence, uncomplicated: Secondary | ICD-10-CM | POA: Diagnosis not present

## 2019-02-28 DIAGNOSIS — R32 Unspecified urinary incontinence: Secondary | ICD-10-CM | POA: Diagnosis not present

## 2019-02-28 DIAGNOSIS — M5000 Cervical disc disorder with myelopathy, unspecified cervical region: Secondary | ICD-10-CM | POA: Diagnosis not present

## 2019-02-28 DIAGNOSIS — E161 Other hypoglycemia: Secondary | ICD-10-CM | POA: Diagnosis not present

## 2019-02-28 DIAGNOSIS — F419 Anxiety disorder, unspecified: Secondary | ICD-10-CM | POA: Diagnosis not present

## 2019-02-28 DIAGNOSIS — M50322 Other cervical disc degeneration at C5-C6 level: Secondary | ICD-10-CM | POA: Diagnosis not present

## 2019-02-28 DIAGNOSIS — M542 Cervicalgia: Secondary | ICD-10-CM | POA: Diagnosis not present

## 2019-02-28 DIAGNOSIS — I10 Essential (primary) hypertension: Secondary | ICD-10-CM | POA: Diagnosis not present

## 2019-02-28 DIAGNOSIS — R000 Tachycardia, unspecified: Secondary | ICD-10-CM | POA: Diagnosis not present

## 2019-02-28 DIAGNOSIS — Z8659 Personal history of other mental and behavioral disorders: Secondary | ICD-10-CM | POA: Diagnosis not present

## 2019-02-28 DIAGNOSIS — F17210 Nicotine dependence, cigarettes, uncomplicated: Secondary | ICD-10-CM | POA: Diagnosis not present

## 2019-02-28 DIAGNOSIS — F410 Panic disorder [episodic paroxysmal anxiety] without agoraphobia: Secondary | ICD-10-CM | POA: Diagnosis not present

## 2019-02-28 DIAGNOSIS — M50323 Other cervical disc degeneration at C6-C7 level: Secondary | ICD-10-CM | POA: Diagnosis not present

## 2019-02-28 DIAGNOSIS — F1099 Alcohol use, unspecified with unspecified alcohol-induced disorder: Secondary | ICD-10-CM | POA: Diagnosis not present

## 2019-02-28 DIAGNOSIS — F10239 Alcohol dependence with withdrawal, unspecified: Secondary | ICD-10-CM | POA: Diagnosis not present

## 2019-02-28 DIAGNOSIS — F13239 Sedative, hypnotic or anxiolytic dependence with withdrawal, unspecified: Secondary | ICD-10-CM | POA: Diagnosis not present

## 2019-02-28 DIAGNOSIS — E162 Hypoglycemia, unspecified: Secondary | ICD-10-CM | POA: Diagnosis not present

## 2019-02-28 DIAGNOSIS — R918 Other nonspecific abnormal finding of lung field: Secondary | ICD-10-CM | POA: Diagnosis not present

## 2019-02-28 DIAGNOSIS — R251 Tremor, unspecified: Secondary | ICD-10-CM | POA: Diagnosis not present

## 2019-02-28 DIAGNOSIS — Z681 Body mass index (BMI) 19 or less, adult: Secondary | ICD-10-CM | POA: Diagnosis not present

## 2019-03-04 DIAGNOSIS — M6281 Muscle weakness (generalized): Secondary | ICD-10-CM | POA: Diagnosis not present

## 2019-03-04 DIAGNOSIS — E870 Hyperosmolality and hypernatremia: Secondary | ICD-10-CM | POA: Diagnosis not present

## 2019-03-04 DIAGNOSIS — I10 Essential (primary) hypertension: Secondary | ICD-10-CM | POA: Diagnosis not present

## 2019-03-04 DIAGNOSIS — F419 Anxiety disorder, unspecified: Secondary | ICD-10-CM | POA: Diagnosis not present

## 2019-03-04 DIAGNOSIS — F1029 Alcohol dependence with unspecified alcohol-induced disorder: Secondary | ICD-10-CM | POA: Diagnosis not present

## 2019-03-04 DIAGNOSIS — M4802 Spinal stenosis, cervical region: Secondary | ICD-10-CM | POA: Diagnosis not present

## 2019-03-04 DIAGNOSIS — G4089 Other seizures: Secondary | ICD-10-CM | POA: Diagnosis not present

## 2019-03-04 DIAGNOSIS — J449 Chronic obstructive pulmonary disease, unspecified: Secondary | ICD-10-CM | POA: Diagnosis not present

## 2019-03-04 DIAGNOSIS — R251 Tremor, unspecified: Secondary | ICD-10-CM | POA: Diagnosis not present

## 2019-03-06 DIAGNOSIS — E870 Hyperosmolality and hypernatremia: Secondary | ICD-10-CM | POA: Diagnosis not present

## 2019-03-06 DIAGNOSIS — F419 Anxiety disorder, unspecified: Secondary | ICD-10-CM | POA: Diagnosis not present

## 2019-03-06 DIAGNOSIS — G4089 Other seizures: Secondary | ICD-10-CM | POA: Diagnosis not present

## 2019-03-06 DIAGNOSIS — M6281 Muscle weakness (generalized): Secondary | ICD-10-CM | POA: Diagnosis not present

## 2019-03-06 DIAGNOSIS — M4802 Spinal stenosis, cervical region: Secondary | ICD-10-CM | POA: Diagnosis not present

## 2019-03-06 DIAGNOSIS — R251 Tremor, unspecified: Secondary | ICD-10-CM | POA: Diagnosis not present

## 2019-03-06 DIAGNOSIS — I10 Essential (primary) hypertension: Secondary | ICD-10-CM | POA: Diagnosis not present

## 2019-03-06 DIAGNOSIS — F1029 Alcohol dependence with unspecified alcohol-induced disorder: Secondary | ICD-10-CM | POA: Diagnosis not present

## 2019-03-06 DIAGNOSIS — J449 Chronic obstructive pulmonary disease, unspecified: Secondary | ICD-10-CM | POA: Diagnosis not present

## 2019-03-07 DIAGNOSIS — E870 Hyperosmolality and hypernatremia: Secondary | ICD-10-CM | POA: Diagnosis not present

## 2019-03-07 DIAGNOSIS — F1029 Alcohol dependence with unspecified alcohol-induced disorder: Secondary | ICD-10-CM | POA: Diagnosis not present

## 2019-03-07 DIAGNOSIS — I10 Essential (primary) hypertension: Secondary | ICD-10-CM | POA: Diagnosis not present

## 2019-03-07 DIAGNOSIS — G4089 Other seizures: Secondary | ICD-10-CM | POA: Diagnosis not present

## 2019-03-07 DIAGNOSIS — F419 Anxiety disorder, unspecified: Secondary | ICD-10-CM | POA: Diagnosis not present

## 2019-03-07 DIAGNOSIS — M6281 Muscle weakness (generalized): Secondary | ICD-10-CM | POA: Diagnosis not present

## 2019-03-07 DIAGNOSIS — J449 Chronic obstructive pulmonary disease, unspecified: Secondary | ICD-10-CM | POA: Diagnosis not present

## 2019-03-07 DIAGNOSIS — M4802 Spinal stenosis, cervical region: Secondary | ICD-10-CM | POA: Diagnosis not present

## 2019-03-07 DIAGNOSIS — R251 Tremor, unspecified: Secondary | ICD-10-CM | POA: Diagnosis not present

## 2019-03-11 DIAGNOSIS — Z9181 History of falling: Secondary | ICD-10-CM | POA: Diagnosis not present

## 2019-03-11 DIAGNOSIS — M109 Gout, unspecified: Secondary | ICD-10-CM | POA: Diagnosis not present

## 2019-03-11 DIAGNOSIS — G4089 Other seizures: Secondary | ICD-10-CM | POA: Diagnosis not present

## 2019-03-11 DIAGNOSIS — J449 Chronic obstructive pulmonary disease, unspecified: Secondary | ICD-10-CM | POA: Diagnosis not present

## 2019-03-11 DIAGNOSIS — E785 Hyperlipidemia, unspecified: Secondary | ICD-10-CM | POA: Diagnosis not present

## 2019-03-11 DIAGNOSIS — I10 Essential (primary) hypertension: Secondary | ICD-10-CM | POA: Diagnosis not present

## 2019-03-11 DIAGNOSIS — M4802 Spinal stenosis, cervical region: Secondary | ICD-10-CM | POA: Diagnosis not present

## 2019-03-11 DIAGNOSIS — F1029 Alcohol dependence with unspecified alcohol-induced disorder: Secondary | ICD-10-CM | POA: Diagnosis not present

## 2019-03-11 DIAGNOSIS — M6281 Muscle weakness (generalized): Secondary | ICD-10-CM | POA: Diagnosis not present

## 2019-03-11 DIAGNOSIS — E870 Hyperosmolality and hypernatremia: Secondary | ICD-10-CM | POA: Diagnosis not present

## 2019-03-11 DIAGNOSIS — Z0000 Encounter for general adult medical examination without abnormal findings: Secondary | ICD-10-CM | POA: Diagnosis not present

## 2019-03-11 DIAGNOSIS — N401 Enlarged prostate with lower urinary tract symptoms: Secondary | ICD-10-CM | POA: Diagnosis not present

## 2019-03-11 DIAGNOSIS — F419 Anxiety disorder, unspecified: Secondary | ICD-10-CM | POA: Diagnosis not present

## 2019-03-11 DIAGNOSIS — F339 Major depressive disorder, recurrent, unspecified: Secondary | ICD-10-CM | POA: Diagnosis not present

## 2019-03-11 DIAGNOSIS — Z79899 Other long term (current) drug therapy: Secondary | ICD-10-CM | POA: Diagnosis not present

## 2019-03-11 DIAGNOSIS — R7989 Other specified abnormal findings of blood chemistry: Secondary | ICD-10-CM | POA: Diagnosis not present

## 2019-03-11 DIAGNOSIS — R251 Tremor, unspecified: Secondary | ICD-10-CM | POA: Diagnosis not present

## 2019-03-11 DIAGNOSIS — R739 Hyperglycemia, unspecified: Secondary | ICD-10-CM | POA: Diagnosis not present

## 2019-03-11 DIAGNOSIS — R569 Unspecified convulsions: Secondary | ICD-10-CM | POA: Diagnosis not present

## 2019-03-13 DIAGNOSIS — I10 Essential (primary) hypertension: Secondary | ICD-10-CM | POA: Diagnosis not present

## 2019-03-13 DIAGNOSIS — M4802 Spinal stenosis, cervical region: Secondary | ICD-10-CM | POA: Diagnosis not present

## 2019-03-13 DIAGNOSIS — F419 Anxiety disorder, unspecified: Secondary | ICD-10-CM | POA: Diagnosis not present

## 2019-03-13 DIAGNOSIS — E870 Hyperosmolality and hypernatremia: Secondary | ICD-10-CM | POA: Diagnosis not present

## 2019-03-13 DIAGNOSIS — G4089 Other seizures: Secondary | ICD-10-CM | POA: Diagnosis not present

## 2019-03-13 DIAGNOSIS — R251 Tremor, unspecified: Secondary | ICD-10-CM | POA: Diagnosis not present

## 2019-03-13 DIAGNOSIS — J449 Chronic obstructive pulmonary disease, unspecified: Secondary | ICD-10-CM | POA: Diagnosis not present

## 2019-03-13 DIAGNOSIS — M6281 Muscle weakness (generalized): Secondary | ICD-10-CM | POA: Diagnosis not present

## 2019-03-13 DIAGNOSIS — F1029 Alcohol dependence with unspecified alcohol-induced disorder: Secondary | ICD-10-CM | POA: Diagnosis not present

## 2019-03-18 DIAGNOSIS — G4089 Other seizures: Secondary | ICD-10-CM | POA: Diagnosis not present

## 2019-03-18 DIAGNOSIS — F1029 Alcohol dependence with unspecified alcohol-induced disorder: Secondary | ICD-10-CM | POA: Diagnosis not present

## 2019-03-18 DIAGNOSIS — I10 Essential (primary) hypertension: Secondary | ICD-10-CM | POA: Diagnosis not present

## 2019-03-18 DIAGNOSIS — F419 Anxiety disorder, unspecified: Secondary | ICD-10-CM | POA: Diagnosis not present

## 2019-03-18 DIAGNOSIS — R251 Tremor, unspecified: Secondary | ICD-10-CM | POA: Diagnosis not present

## 2019-03-18 DIAGNOSIS — M6281 Muscle weakness (generalized): Secondary | ICD-10-CM | POA: Diagnosis not present

## 2019-03-18 DIAGNOSIS — J449 Chronic obstructive pulmonary disease, unspecified: Secondary | ICD-10-CM | POA: Diagnosis not present

## 2019-03-18 DIAGNOSIS — E870 Hyperosmolality and hypernatremia: Secondary | ICD-10-CM | POA: Diagnosis not present

## 2019-03-18 DIAGNOSIS — M4802 Spinal stenosis, cervical region: Secondary | ICD-10-CM | POA: Diagnosis not present

## 2019-03-20 DIAGNOSIS — M4802 Spinal stenosis, cervical region: Secondary | ICD-10-CM | POA: Diagnosis not present

## 2019-03-20 DIAGNOSIS — F1029 Alcohol dependence with unspecified alcohol-induced disorder: Secondary | ICD-10-CM | POA: Diagnosis not present

## 2019-03-20 DIAGNOSIS — M6281 Muscle weakness (generalized): Secondary | ICD-10-CM | POA: Diagnosis not present

## 2019-03-20 DIAGNOSIS — R251 Tremor, unspecified: Secondary | ICD-10-CM | POA: Diagnosis not present

## 2019-03-20 DIAGNOSIS — I10 Essential (primary) hypertension: Secondary | ICD-10-CM | POA: Diagnosis not present

## 2019-03-20 DIAGNOSIS — J449 Chronic obstructive pulmonary disease, unspecified: Secondary | ICD-10-CM | POA: Diagnosis not present

## 2019-03-20 DIAGNOSIS — F419 Anxiety disorder, unspecified: Secondary | ICD-10-CM | POA: Diagnosis not present

## 2019-03-20 DIAGNOSIS — G4089 Other seizures: Secondary | ICD-10-CM | POA: Diagnosis not present

## 2019-03-20 DIAGNOSIS — E870 Hyperosmolality and hypernatremia: Secondary | ICD-10-CM | POA: Diagnosis not present

## 2019-03-25 DIAGNOSIS — M6281 Muscle weakness (generalized): Secondary | ICD-10-CM | POA: Diagnosis not present

## 2019-03-25 DIAGNOSIS — I10 Essential (primary) hypertension: Secondary | ICD-10-CM | POA: Diagnosis not present

## 2019-03-25 DIAGNOSIS — R251 Tremor, unspecified: Secondary | ICD-10-CM | POA: Diagnosis not present

## 2019-03-25 DIAGNOSIS — J449 Chronic obstructive pulmonary disease, unspecified: Secondary | ICD-10-CM | POA: Diagnosis not present

## 2019-03-25 DIAGNOSIS — M4802 Spinal stenosis, cervical region: Secondary | ICD-10-CM | POA: Diagnosis not present

## 2019-03-25 DIAGNOSIS — G4089 Other seizures: Secondary | ICD-10-CM | POA: Diagnosis not present

## 2019-03-25 DIAGNOSIS — F1029 Alcohol dependence with unspecified alcohol-induced disorder: Secondary | ICD-10-CM | POA: Diagnosis not present

## 2019-03-25 DIAGNOSIS — E870 Hyperosmolality and hypernatremia: Secondary | ICD-10-CM | POA: Diagnosis not present

## 2019-03-25 DIAGNOSIS — F419 Anxiety disorder, unspecified: Secondary | ICD-10-CM | POA: Diagnosis not present

## 2019-03-27 DIAGNOSIS — E870 Hyperosmolality and hypernatremia: Secondary | ICD-10-CM | POA: Diagnosis not present

## 2019-03-27 DIAGNOSIS — I10 Essential (primary) hypertension: Secondary | ICD-10-CM | POA: Diagnosis not present

## 2019-03-27 DIAGNOSIS — R251 Tremor, unspecified: Secondary | ICD-10-CM | POA: Diagnosis not present

## 2019-03-27 DIAGNOSIS — F419 Anxiety disorder, unspecified: Secondary | ICD-10-CM | POA: Diagnosis not present

## 2019-03-27 DIAGNOSIS — M6281 Muscle weakness (generalized): Secondary | ICD-10-CM | POA: Diagnosis not present

## 2019-03-27 DIAGNOSIS — J449 Chronic obstructive pulmonary disease, unspecified: Secondary | ICD-10-CM | POA: Diagnosis not present

## 2019-03-27 DIAGNOSIS — F1029 Alcohol dependence with unspecified alcohol-induced disorder: Secondary | ICD-10-CM | POA: Diagnosis not present

## 2019-03-27 DIAGNOSIS — G4089 Other seizures: Secondary | ICD-10-CM | POA: Diagnosis not present

## 2019-03-27 DIAGNOSIS — M4802 Spinal stenosis, cervical region: Secondary | ICD-10-CM | POA: Diagnosis not present

## 2019-04-03 DIAGNOSIS — R569 Unspecified convulsions: Secondary | ICD-10-CM | POA: Diagnosis not present

## 2019-04-04 DIAGNOSIS — G4089 Other seizures: Secondary | ICD-10-CM | POA: Diagnosis not present

## 2019-04-04 DIAGNOSIS — J449 Chronic obstructive pulmonary disease, unspecified: Secondary | ICD-10-CM | POA: Diagnosis not present

## 2019-04-04 DIAGNOSIS — E870 Hyperosmolality and hypernatremia: Secondary | ICD-10-CM | POA: Diagnosis not present

## 2019-04-04 DIAGNOSIS — R251 Tremor, unspecified: Secondary | ICD-10-CM | POA: Diagnosis not present

## 2019-04-04 DIAGNOSIS — I10 Essential (primary) hypertension: Secondary | ICD-10-CM | POA: Diagnosis not present

## 2019-04-04 DIAGNOSIS — M6281 Muscle weakness (generalized): Secondary | ICD-10-CM | POA: Diagnosis not present

## 2019-04-04 DIAGNOSIS — M4802 Spinal stenosis, cervical region: Secondary | ICD-10-CM | POA: Diagnosis not present

## 2019-04-04 DIAGNOSIS — F419 Anxiety disorder, unspecified: Secondary | ICD-10-CM | POA: Diagnosis not present

## 2019-04-04 DIAGNOSIS — F1029 Alcohol dependence with unspecified alcohol-induced disorder: Secondary | ICD-10-CM | POA: Diagnosis not present

## 2019-06-01 DIAGNOSIS — R0781 Pleurodynia: Secondary | ICD-10-CM | POA: Diagnosis not present

## 2019-06-01 DIAGNOSIS — S299XXA Unspecified injury of thorax, initial encounter: Secondary | ICD-10-CM | POA: Diagnosis not present

## 2019-06-01 DIAGNOSIS — W01198A Fall on same level from slipping, tripping and stumbling with subsequent striking against other object, initial encounter: Secondary | ICD-10-CM | POA: Diagnosis not present

## 2019-06-01 DIAGNOSIS — R52 Pain, unspecified: Secondary | ICD-10-CM | POA: Diagnosis not present

## 2019-06-01 DIAGNOSIS — F17210 Nicotine dependence, cigarettes, uncomplicated: Secondary | ICD-10-CM | POA: Diagnosis not present

## 2019-06-01 DIAGNOSIS — J439 Emphysema, unspecified: Secondary | ICD-10-CM | POA: Diagnosis not present

## 2019-06-01 DIAGNOSIS — Y998 Other external cause status: Secondary | ICD-10-CM | POA: Diagnosis not present

## 2019-06-01 DIAGNOSIS — W19XXXA Unspecified fall, initial encounter: Secondary | ICD-10-CM | POA: Diagnosis not present

## 2019-06-10 DIAGNOSIS — R569 Unspecified convulsions: Secondary | ICD-10-CM | POA: Diagnosis not present

## 2019-06-10 DIAGNOSIS — G629 Polyneuropathy, unspecified: Secondary | ICD-10-CM | POA: Diagnosis not present

## 2019-06-10 DIAGNOSIS — S2231XS Fracture of one rib, right side, sequela: Secondary | ICD-10-CM | POA: Diagnosis not present

## 2019-06-10 DIAGNOSIS — Z6821 Body mass index (BMI) 21.0-21.9, adult: Secondary | ICD-10-CM | POA: Diagnosis not present

## 2019-06-10 DIAGNOSIS — F419 Anxiety disorder, unspecified: Secondary | ICD-10-CM | POA: Diagnosis not present

## 2019-06-10 DIAGNOSIS — F339 Major depressive disorder, recurrent, unspecified: Secondary | ICD-10-CM | POA: Diagnosis not present

## 2019-08-11 DIAGNOSIS — E785 Hyperlipidemia, unspecified: Secondary | ICD-10-CM | POA: Diagnosis not present

## 2019-08-11 DIAGNOSIS — J449 Chronic obstructive pulmonary disease, unspecified: Secondary | ICD-10-CM | POA: Diagnosis not present

## 2019-08-11 DIAGNOSIS — F339 Major depressive disorder, recurrent, unspecified: Secondary | ICD-10-CM | POA: Diagnosis not present

## 2019-08-11 DIAGNOSIS — Z6821 Body mass index (BMI) 21.0-21.9, adult: Secondary | ICD-10-CM | POA: Diagnosis not present

## 2019-08-11 DIAGNOSIS — F419 Anxiety disorder, unspecified: Secondary | ICD-10-CM | POA: Diagnosis not present

## 2019-08-11 DIAGNOSIS — M109 Gout, unspecified: Secondary | ICD-10-CM | POA: Diagnosis not present

## 2019-08-11 DIAGNOSIS — Z79899 Other long term (current) drug therapy: Secondary | ICD-10-CM | POA: Diagnosis not present

## 2019-08-11 DIAGNOSIS — Z23 Encounter for immunization: Secondary | ICD-10-CM | POA: Diagnosis not present

## 2019-08-12 DIAGNOSIS — M4802 Spinal stenosis, cervical region: Secondary | ICD-10-CM | POA: Diagnosis not present

## 2019-08-12 DIAGNOSIS — M4322 Fusion of spine, cervical region: Secondary | ICD-10-CM | POA: Diagnosis not present

## 2019-08-12 DIAGNOSIS — S14129D Central cord syndrome at unspecified level of cervical spinal cord, subsequent encounter: Secondary | ICD-10-CM | POA: Diagnosis not present

## 2019-08-12 DIAGNOSIS — M4712 Other spondylosis with myelopathy, cervical region: Secondary | ICD-10-CM | POA: Diagnosis not present

## 2019-08-12 DIAGNOSIS — M4722 Other spondylosis with radiculopathy, cervical region: Secondary | ICD-10-CM | POA: Diagnosis not present

## 2019-09-25 DIAGNOSIS — Z6821 Body mass index (BMI) 21.0-21.9, adult: Secondary | ICD-10-CM | POA: Diagnosis not present

## 2019-09-25 DIAGNOSIS — M25561 Pain in right knee: Secondary | ICD-10-CM | POA: Diagnosis not present

## 2019-09-25 DIAGNOSIS — M159 Polyosteoarthritis, unspecified: Secondary | ICD-10-CM | POA: Diagnosis not present

## 2019-10-09 DIAGNOSIS — Z6822 Body mass index (BMI) 22.0-22.9, adult: Secondary | ICD-10-CM | POA: Diagnosis not present

## 2019-10-09 DIAGNOSIS — M25561 Pain in right knee: Secondary | ICD-10-CM | POA: Diagnosis not present

## 2019-10-09 DIAGNOSIS — M159 Polyosteoarthritis, unspecified: Secondary | ICD-10-CM | POA: Diagnosis not present

## 2019-10-23 DIAGNOSIS — M25561 Pain in right knee: Secondary | ICD-10-CM | POA: Diagnosis not present

## 2019-10-23 DIAGNOSIS — Z6822 Body mass index (BMI) 22.0-22.9, adult: Secondary | ICD-10-CM | POA: Diagnosis not present

## 2019-10-23 DIAGNOSIS — M159 Polyosteoarthritis, unspecified: Secondary | ICD-10-CM | POA: Diagnosis not present

## 2019-11-12 DIAGNOSIS — F419 Anxiety disorder, unspecified: Secondary | ICD-10-CM | POA: Diagnosis not present

## 2019-11-12 DIAGNOSIS — F339 Major depressive disorder, recurrent, unspecified: Secondary | ICD-10-CM | POA: Diagnosis not present

## 2019-11-12 DIAGNOSIS — E785 Hyperlipidemia, unspecified: Secondary | ICD-10-CM | POA: Diagnosis not present

## 2019-11-12 DIAGNOSIS — Z79899 Other long term (current) drug therapy: Secondary | ICD-10-CM | POA: Diagnosis not present

## 2019-11-12 DIAGNOSIS — Z6821 Body mass index (BMI) 21.0-21.9, adult: Secondary | ICD-10-CM | POA: Diagnosis not present

## 2019-11-12 DIAGNOSIS — J449 Chronic obstructive pulmonary disease, unspecified: Secondary | ICD-10-CM | POA: Diagnosis not present

## 2019-11-12 DIAGNOSIS — M109 Gout, unspecified: Secondary | ICD-10-CM | POA: Diagnosis not present

## 2019-11-12 DIAGNOSIS — I700 Atherosclerosis of aorta: Secondary | ICD-10-CM | POA: Diagnosis not present

## 2019-11-12 DIAGNOSIS — R569 Unspecified convulsions: Secondary | ICD-10-CM | POA: Diagnosis not present

## 2019-11-18 DIAGNOSIS — M4722 Other spondylosis with radiculopathy, cervical region: Secondary | ICD-10-CM | POA: Diagnosis not present

## 2019-11-18 DIAGNOSIS — M961 Postlaminectomy syndrome, not elsewhere classified: Secondary | ICD-10-CM | POA: Diagnosis not present

## 2019-11-18 DIAGNOSIS — M4322 Fusion of spine, cervical region: Secondary | ICD-10-CM | POA: Diagnosis not present

## 2019-11-18 DIAGNOSIS — M4712 Other spondylosis with myelopathy, cervical region: Secondary | ICD-10-CM | POA: Diagnosis not present

## 2019-12-03 DIAGNOSIS — M47812 Spondylosis without myelopathy or radiculopathy, cervical region: Secondary | ICD-10-CM | POA: Diagnosis not present

## 2019-12-04 DIAGNOSIS — M4322 Fusion of spine, cervical region: Secondary | ICD-10-CM | POA: Diagnosis not present

## 2019-12-04 DIAGNOSIS — S14129D Central cord syndrome at unspecified level of cervical spinal cord, subsequent encounter: Secondary | ICD-10-CM | POA: Diagnosis not present

## 2019-12-04 DIAGNOSIS — M961 Postlaminectomy syndrome, not elsewhere classified: Secondary | ICD-10-CM | POA: Diagnosis not present

## 2019-12-04 DIAGNOSIS — M4712 Other spondylosis with myelopathy, cervical region: Secondary | ICD-10-CM | POA: Diagnosis not present

## 2020-01-13 DIAGNOSIS — M4322 Fusion of spine, cervical region: Secondary | ICD-10-CM | POA: Diagnosis not present

## 2020-01-13 DIAGNOSIS — M4712 Other spondylosis with myelopathy, cervical region: Secondary | ICD-10-CM | POA: Diagnosis not present

## 2020-01-13 DIAGNOSIS — M961 Postlaminectomy syndrome, not elsewhere classified: Secondary | ICD-10-CM | POA: Diagnosis not present

## 2020-01-13 DIAGNOSIS — S14129D Central cord syndrome at unspecified level of cervical spinal cord, subsequent encounter: Secondary | ICD-10-CM | POA: Diagnosis not present

## 2020-01-28 IMAGING — MR MR HEAD W/O CM
14 of 19 series · 28 of 48 positions shown · non-contrast
Comparison: MRI head and cervical spine April 10, 2018.

CLINICAL DATA: Unable to ambulate. Movement disorder. History of
spinal cord anomaly, falls with alcoholism. Assess cervical spine
stenosis.

EXAM:
MRI HEAD WITHOUT CONTRAST
MRI CERVICAL SPINE WITHOUT CONTRAST
TECHNIQUE: Multiplanar, multiecho pulse sequences of the brain and surrounding
structures, and cervical spine, to include the craniocervical
junction and cervicothoracic junction, were obtained without
intravenous contrast.

[Series 2: T2 · sagittal · 3.0mm · 0.43mm/px · 1 of 13 slices shown (1 of 5)]
[im 1/13]
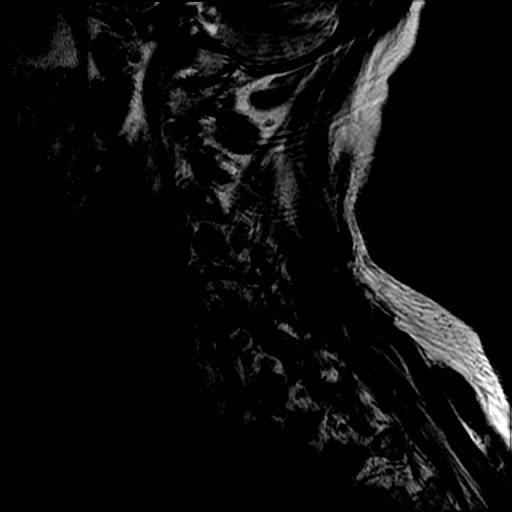

[Series 3: T2 · sagittal · 3.0mm · 0.43mm/px · 1 of 13 slices shown (2 of 5)]
[im 1/13]
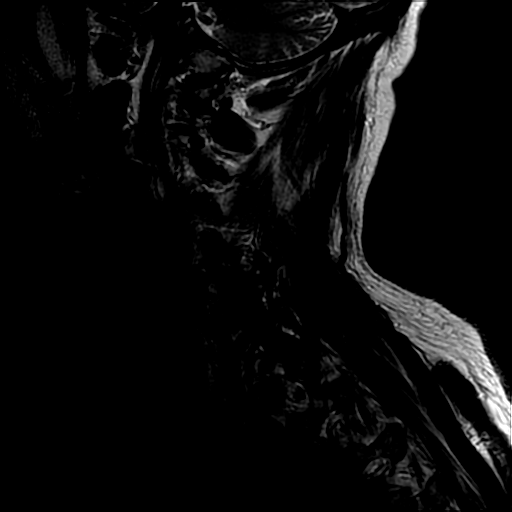

[Series 4: FLAIR · sagittal · 3.0mm · 0.43mm/px · 1 of 13 slices shown (1 of 2)]
[im 1/13]
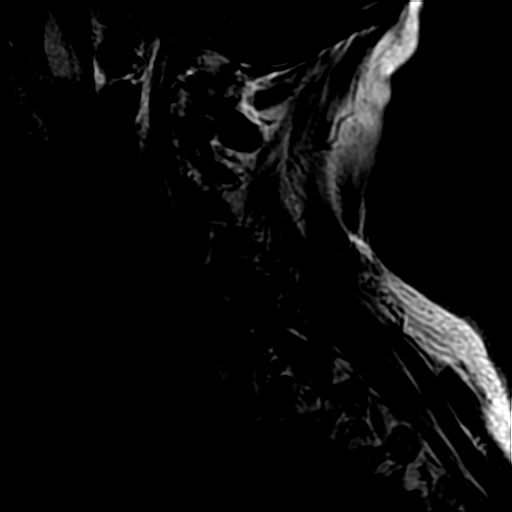

[Series 5: sag ir · sagittal · 3.0mm · 0.43mm/px · 1 of 13 slices shown]
[im 1/13]
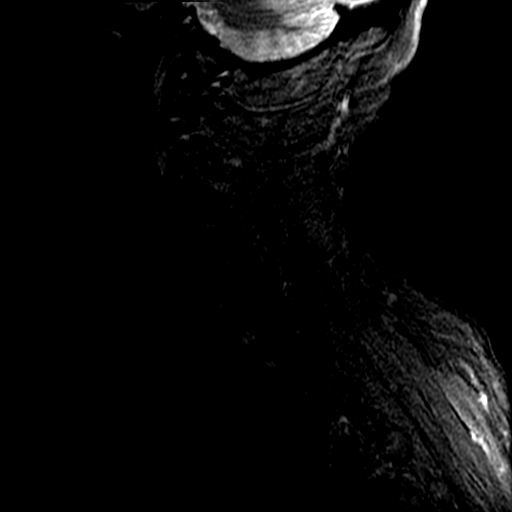

[Series 6: T2 · axial · 3.0mm · 0.43mm/px · 1 of 33 slices shown (3 of 5)]
[im 1/33]
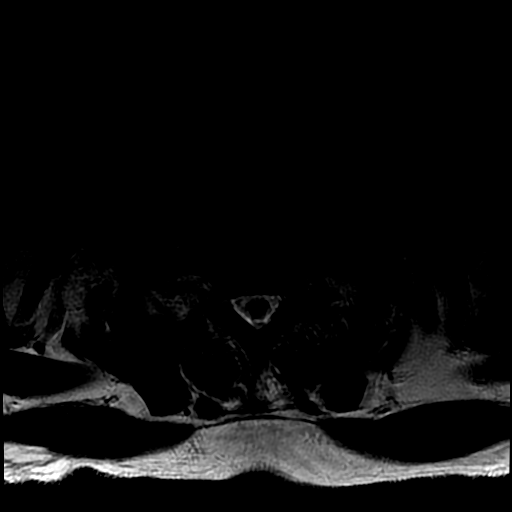

[Series 7: bSSFP · axial · 2.0mm · 0.39mm/px · 1 of 115 slices shown]
[im 1/115]
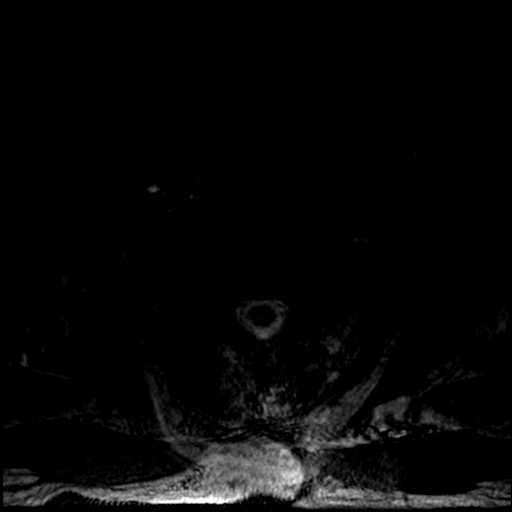

[Series 14: DWI · axial · 3.0mm · 1.09mm/px · z∈[+132,+276]mm · 6 of 98 slices shown (1 of 4)]
[im 1/98]
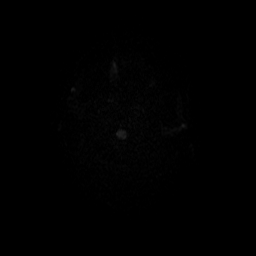
[im 20/98]
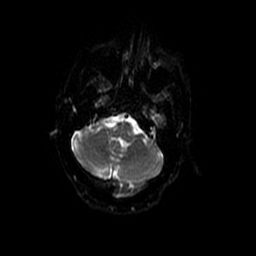
[im 39/98]
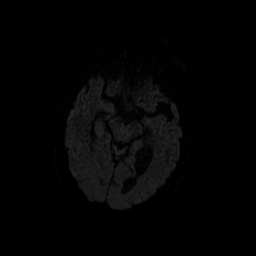
[im 59/98]
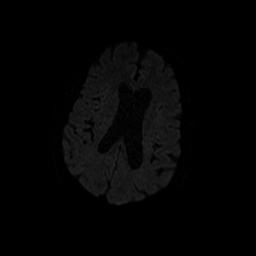
[im 78/98]
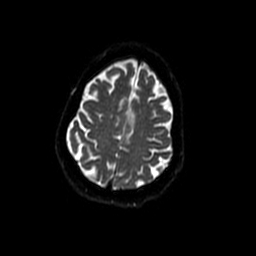
[im 98/98]
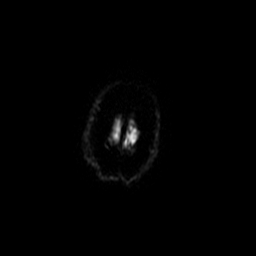

[Series 15: DWI · coronal · 5.0mm · 1.09mm/px · 4 of 72 slices shown (2 of 4)]
[im 1/72]
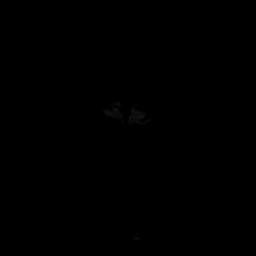
[im 24/72]
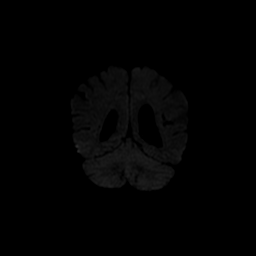
[im 48/72]
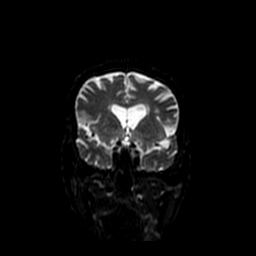
[im 72/72]
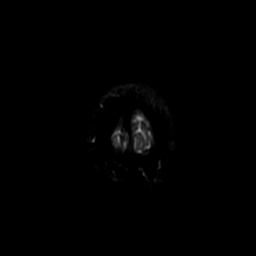

[Series 17: T2 · axial · 5.0mm · 0.47mm/px · z∈[+132,+276]mm · 2 of 25 slices shown (4 of 5)]
[im 1/25]
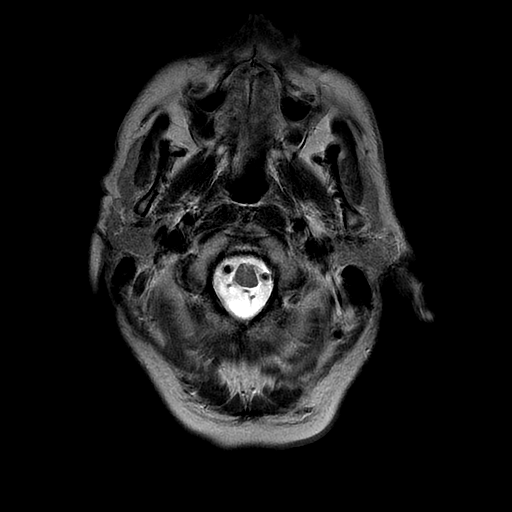
[im 25/25]
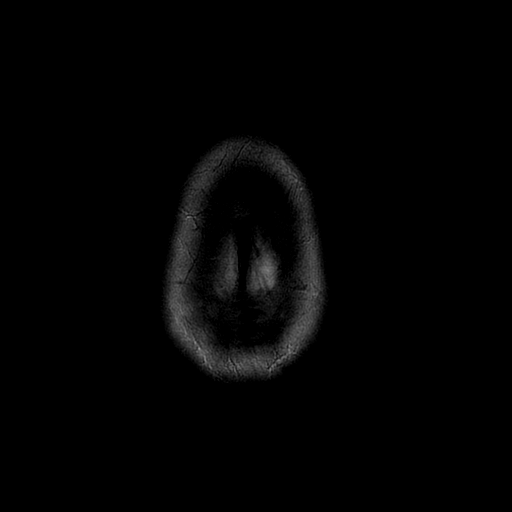

[Series 18: T1 · sagittal · 5.0mm · 0.47mm/px · 1 of 23 slices shown]
[im 1/23]
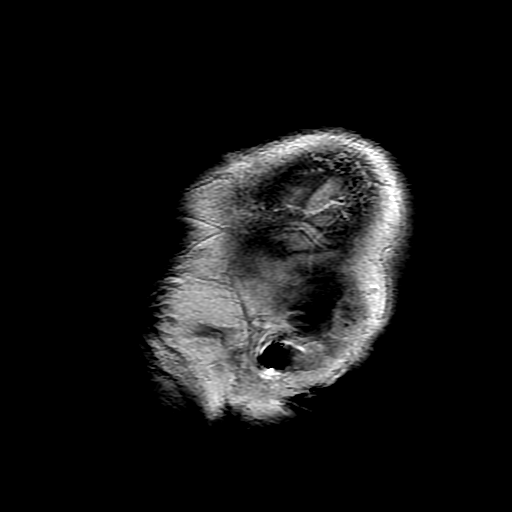

[Series 19: FLAIR · axial · 3.0mm · 0.47mm/px · z∈[+132,+276]mm · 2 of 25 slices shown (2 of 2)]
[im 1/25]
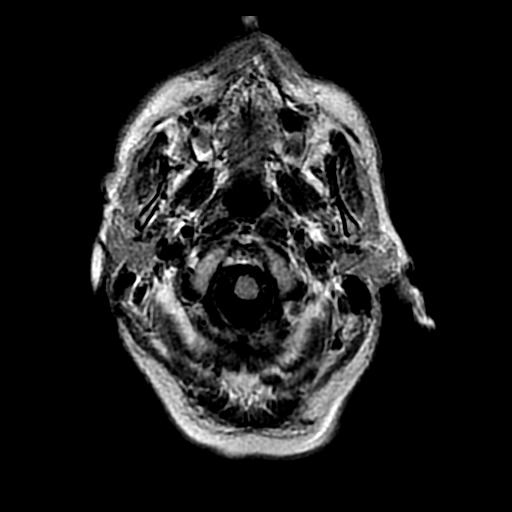
[im 25/25]
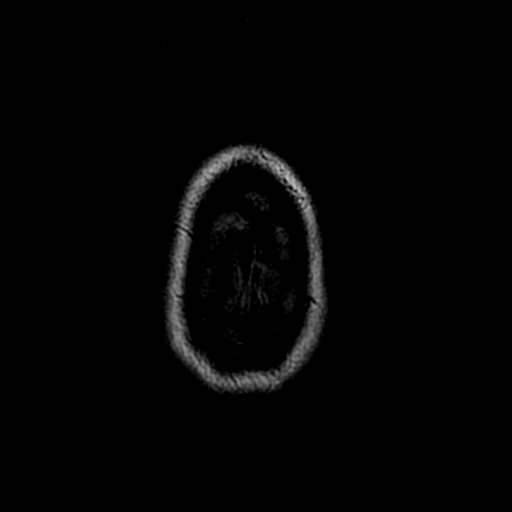

[Series 20: T2 · coronal · 5.0mm · 0.43mm/px · 2 of 31 slices shown (5 of 5)]
[im 1/31]
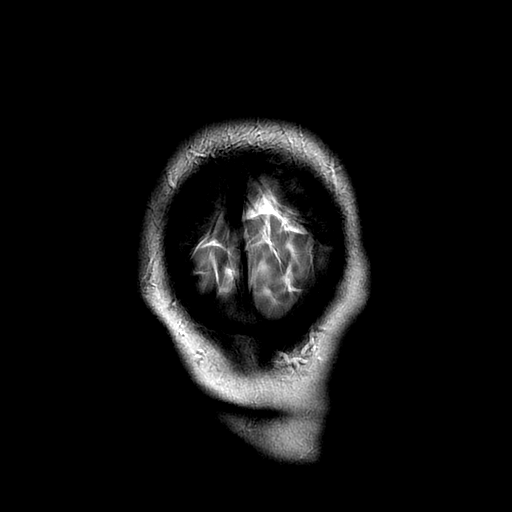
[im 31/31]
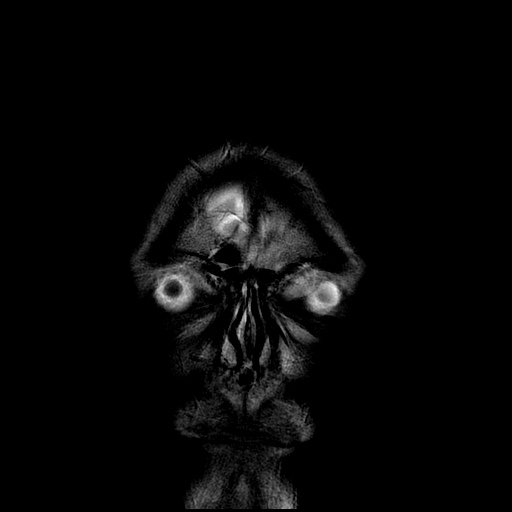

[Series 1400: DWI · axial · 3.0mm · 1.09mm/px · z∈[+132,+276]mm · 3 of 49 slices shown (3 of 4)]
[im 1/49]
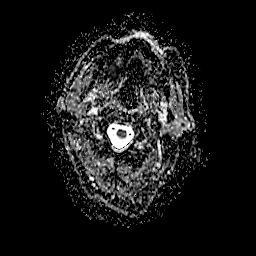
[im 25/49]
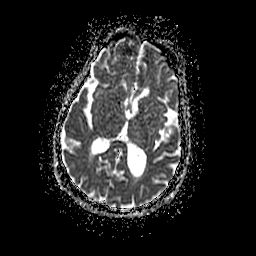
[im 49/49]
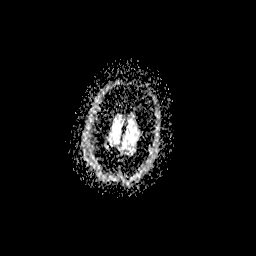

[Series 1500: DWI · coronal · 5.0mm · 1.09mm/px · 2 of 36 slices shown (4 of 4)]
[im 1/36]
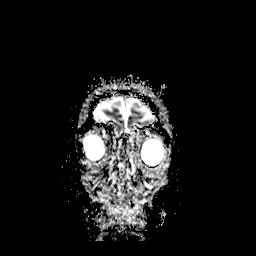
[im 36/36]
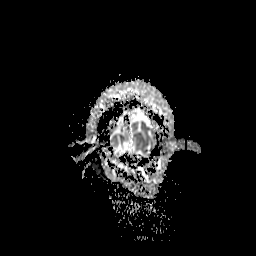

[28 of 48 positions shown; findings below may reference images not displayed]

FINDINGS: Moderate motion degraded examination, worse than yesterday's
examination.

MRI HEAD FINDINGS

INTRACRANIAL CONTENTS: No reduced diffusion to suggest acute
ischemia. No susceptibility artifact to suggest hemorrhage. The
ventricles and sulci are normal for patient's age. A few scattered
subcentimeter supratentorial white matter FLAIR T2 hyperintensities
compatible with mild chronic small vessel ischemic changes. No
suspicious parenchymal signal, masses, mass effect. No abnormal
extra-axial fluid collections. No extra-axial masses.

VASCULAR: Normal major intracranial vascular flow voids present at
skull base.

SKULL AND UPPER CERVICAL SPINE: No abnormal sellar expansion. No
suspicious calvarial bone marrow signal. Craniocervical junction
maintained.

SINUSES/ORBITS: Trace paranasal sinus mucosal thickening.The
included ocular globes and orbital contents are non-suspicious.

OTHER: Patient is edentulous.

Moderate to severely motion degraded examination, worse than
yesterday's examination.

MRI CERVICAL SPINE FINDINGS

ALIGNMENT: Maintained cervical lordosis. Minimal C3-4
retrolisthesis.

VERTEBRAE/DISCS: Vertebral bodies are intact. Intervertebral disc
morphology's maintained with mild desiccation. Multilevel mild
chronic discogenic endplate changes are without convincing evidence
of acute or abnormal bone marrow signal abnormality.

CORD:Cervical spinal cord compression at C3-4 with mild by a cord
edema measuring approximately 1 cm. No syrinx or myelomalacia.

POSTERIOR FOSSA, VERTEBRAL ARTERIES, PARASPINAL TISSUES: Limited
assessment for ligamentous injury due to motion without definite
paraspinal or ligamentous STIR signal abnormality. Vertebral artery
flow voids generally maintained.

DISC LEVELS:

Severe canal stenosis at C3-4, 5 mm in AP dimension. No additional
levels of severe canal stenosis. Severe suspected bilateral C3-4
neural foraminal narrowing.
IMPRESSION: MRI head:

1. Moderately motion degraded examination, worse than yesterday's
MRI head.
2. No acute intracranial process; negative noncontrast MRI head.

MRI cervical spine:

1. Moderate to severely motion degraded examination, worse than
yesterday's MRI cervical spine.
2. Severe canal stenosis C3-4 with focal cord edema. No syrinx.
Severe suspected bilateral C3-4 neural foraminal narrowing.
3. Given inability to tolerate MRI, CT cervical spine may be of
added value or, re-attempt MRI with sedation.

## 2020-02-11 DIAGNOSIS — F419 Anxiety disorder, unspecified: Secondary | ICD-10-CM | POA: Diagnosis not present

## 2020-02-11 DIAGNOSIS — Z87891 Personal history of nicotine dependence: Secondary | ICD-10-CM | POA: Diagnosis not present

## 2020-02-11 DIAGNOSIS — Z6820 Body mass index (BMI) 20.0-20.9, adult: Secondary | ICD-10-CM | POA: Diagnosis not present

## 2020-02-11 DIAGNOSIS — J449 Chronic obstructive pulmonary disease, unspecified: Secondary | ICD-10-CM | POA: Diagnosis not present

## 2020-02-11 DIAGNOSIS — E785 Hyperlipidemia, unspecified: Secondary | ICD-10-CM | POA: Diagnosis not present

## 2020-02-11 DIAGNOSIS — F339 Major depressive disorder, recurrent, unspecified: Secondary | ICD-10-CM | POA: Diagnosis not present

## 2020-02-12 DIAGNOSIS — R569 Unspecified convulsions: Secondary | ICD-10-CM | POA: Diagnosis not present

## 2020-03-12 DIAGNOSIS — Z125 Encounter for screening for malignant neoplasm of prostate: Secondary | ICD-10-CM | POA: Diagnosis not present

## 2020-03-12 DIAGNOSIS — Z1211 Encounter for screening for malignant neoplasm of colon: Secondary | ICD-10-CM | POA: Diagnosis not present

## 2020-03-12 DIAGNOSIS — Z1331 Encounter for screening for depression: Secondary | ICD-10-CM | POA: Diagnosis not present

## 2020-03-12 DIAGNOSIS — Z0000 Encounter for general adult medical examination without abnormal findings: Secondary | ICD-10-CM | POA: Diagnosis not present

## 2020-03-12 DIAGNOSIS — Z9181 History of falling: Secondary | ICD-10-CM | POA: Diagnosis not present

## 2020-03-12 DIAGNOSIS — E785 Hyperlipidemia, unspecified: Secondary | ICD-10-CM | POA: Diagnosis not present

## 2020-05-12 DIAGNOSIS — Z79899 Other long term (current) drug therapy: Secondary | ICD-10-CM | POA: Diagnosis not present

## 2020-05-12 DIAGNOSIS — M109 Gout, unspecified: Secondary | ICD-10-CM | POA: Diagnosis not present

## 2020-05-12 DIAGNOSIS — Z6820 Body mass index (BMI) 20.0-20.9, adult: Secondary | ICD-10-CM | POA: Diagnosis not present

## 2020-05-12 DIAGNOSIS — I700 Atherosclerosis of aorta: Secondary | ICD-10-CM | POA: Diagnosis not present

## 2020-05-12 DIAGNOSIS — R569 Unspecified convulsions: Secondary | ICD-10-CM | POA: Diagnosis not present

## 2020-05-12 DIAGNOSIS — F339 Major depressive disorder, recurrent, unspecified: Secondary | ICD-10-CM | POA: Diagnosis not present

## 2020-05-12 DIAGNOSIS — E785 Hyperlipidemia, unspecified: Secondary | ICD-10-CM | POA: Diagnosis not present

## 2020-05-12 DIAGNOSIS — F419 Anxiety disorder, unspecified: Secondary | ICD-10-CM | POA: Diagnosis not present

## 2020-05-12 DIAGNOSIS — J449 Chronic obstructive pulmonary disease, unspecified: Secondary | ICD-10-CM | POA: Diagnosis not present

## 2020-06-22 DIAGNOSIS — Z0100 Encounter for examination of eyes and vision without abnormal findings: Secondary | ICD-10-CM | POA: Diagnosis not present

## 2020-07-15 DIAGNOSIS — S14129D Central cord syndrome at unspecified level of cervical spinal cord, subsequent encounter: Secondary | ICD-10-CM | POA: Diagnosis not present

## 2020-07-15 DIAGNOSIS — M4712 Other spondylosis with myelopathy, cervical region: Secondary | ICD-10-CM | POA: Diagnosis not present

## 2020-07-15 DIAGNOSIS — M961 Postlaminectomy syndrome, not elsewhere classified: Secondary | ICD-10-CM | POA: Diagnosis not present

## 2020-07-15 DIAGNOSIS — M4322 Fusion of spine, cervical region: Secondary | ICD-10-CM | POA: Diagnosis not present

## 2020-07-24 DIAGNOSIS — M109 Gout, unspecified: Secondary | ICD-10-CM | POA: Diagnosis not present

## 2020-07-24 DIAGNOSIS — J45909 Unspecified asthma, uncomplicated: Secondary | ICD-10-CM | POA: Diagnosis not present

## 2020-07-24 DIAGNOSIS — M25561 Pain in right knee: Secondary | ICD-10-CM | POA: Diagnosis not present

## 2020-07-24 DIAGNOSIS — M2469 Ankylosis, other specified joint: Secondary | ICD-10-CM | POA: Diagnosis not present

## 2020-07-24 DIAGNOSIS — F329 Major depressive disorder, single episode, unspecified: Secondary | ICD-10-CM | POA: Diagnosis not present

## 2020-07-24 DIAGNOSIS — Z4789 Encounter for other orthopedic aftercare: Secondary | ICD-10-CM | POA: Diagnosis not present

## 2020-07-24 DIAGNOSIS — Z9181 History of falling: Secondary | ICD-10-CM | POA: Diagnosis not present

## 2020-07-24 DIAGNOSIS — S14129D Central cord syndrome at unspecified level of cervical spinal cord, subsequent encounter: Secondary | ICD-10-CM | POA: Diagnosis not present

## 2020-07-24 DIAGNOSIS — M4712 Other spondylosis with myelopathy, cervical region: Secondary | ICD-10-CM | POA: Diagnosis not present

## 2020-07-24 DIAGNOSIS — G5603 Carpal tunnel syndrome, bilateral upper limbs: Secondary | ICD-10-CM | POA: Diagnosis not present

## 2020-07-24 DIAGNOSIS — M4802 Spinal stenosis, cervical region: Secondary | ICD-10-CM | POA: Diagnosis not present

## 2020-07-24 DIAGNOSIS — F17210 Nicotine dependence, cigarettes, uncomplicated: Secondary | ICD-10-CM | POA: Diagnosis not present

## 2020-07-24 DIAGNOSIS — M150 Primary generalized (osteo)arthritis: Secondary | ICD-10-CM | POA: Diagnosis not present

## 2020-07-24 DIAGNOSIS — F419 Anxiety disorder, unspecified: Secondary | ICD-10-CM | POA: Diagnosis not present

## 2020-07-28 DIAGNOSIS — S14129D Central cord syndrome at unspecified level of cervical spinal cord, subsequent encounter: Secondary | ICD-10-CM | POA: Diagnosis not present

## 2020-07-28 DIAGNOSIS — M25561 Pain in right knee: Secondary | ICD-10-CM | POA: Diagnosis not present

## 2020-07-28 DIAGNOSIS — J45909 Unspecified asthma, uncomplicated: Secondary | ICD-10-CM | POA: Diagnosis not present

## 2020-07-28 DIAGNOSIS — F419 Anxiety disorder, unspecified: Secondary | ICD-10-CM | POA: Diagnosis not present

## 2020-07-28 DIAGNOSIS — M4712 Other spondylosis with myelopathy, cervical region: Secondary | ICD-10-CM | POA: Diagnosis not present

## 2020-07-28 DIAGNOSIS — M4802 Spinal stenosis, cervical region: Secondary | ICD-10-CM | POA: Diagnosis not present

## 2020-07-28 DIAGNOSIS — F17210 Nicotine dependence, cigarettes, uncomplicated: Secondary | ICD-10-CM | POA: Diagnosis not present

## 2020-07-28 DIAGNOSIS — M2469 Ankylosis, other specified joint: Secondary | ICD-10-CM | POA: Diagnosis not present

## 2020-07-28 DIAGNOSIS — F329 Major depressive disorder, single episode, unspecified: Secondary | ICD-10-CM | POA: Diagnosis not present

## 2020-07-29 DIAGNOSIS — S14129D Central cord syndrome at unspecified level of cervical spinal cord, subsequent encounter: Secondary | ICD-10-CM | POA: Diagnosis not present

## 2020-07-29 DIAGNOSIS — M4712 Other spondylosis with myelopathy, cervical region: Secondary | ICD-10-CM | POA: Diagnosis not present

## 2020-07-29 DIAGNOSIS — M4802 Spinal stenosis, cervical region: Secondary | ICD-10-CM | POA: Diagnosis not present

## 2020-07-30 DIAGNOSIS — M4712 Other spondylosis with myelopathy, cervical region: Secondary | ICD-10-CM | POA: Diagnosis not present

## 2020-07-30 DIAGNOSIS — M2469 Ankylosis, other specified joint: Secondary | ICD-10-CM | POA: Diagnosis not present

## 2020-07-30 DIAGNOSIS — M4802 Spinal stenosis, cervical region: Secondary | ICD-10-CM | POA: Diagnosis not present

## 2020-07-30 DIAGNOSIS — M25561 Pain in right knee: Secondary | ICD-10-CM | POA: Diagnosis not present

## 2020-07-30 DIAGNOSIS — F17210 Nicotine dependence, cigarettes, uncomplicated: Secondary | ICD-10-CM | POA: Diagnosis not present

## 2020-07-30 DIAGNOSIS — S14129D Central cord syndrome at unspecified level of cervical spinal cord, subsequent encounter: Secondary | ICD-10-CM | POA: Diagnosis not present

## 2020-07-30 DIAGNOSIS — F329 Major depressive disorder, single episode, unspecified: Secondary | ICD-10-CM | POA: Diagnosis not present

## 2020-07-30 DIAGNOSIS — J45909 Unspecified asthma, uncomplicated: Secondary | ICD-10-CM | POA: Diagnosis not present

## 2020-07-30 DIAGNOSIS — F419 Anxiety disorder, unspecified: Secondary | ICD-10-CM | POA: Diagnosis not present

## 2020-08-03 DIAGNOSIS — M1711 Unilateral primary osteoarthritis, right knee: Secondary | ICD-10-CM | POA: Diagnosis not present

## 2020-08-03 DIAGNOSIS — M25561 Pain in right knee: Secondary | ICD-10-CM | POA: Diagnosis not present

## 2020-08-04 DIAGNOSIS — F329 Major depressive disorder, single episode, unspecified: Secondary | ICD-10-CM | POA: Diagnosis not present

## 2020-08-04 DIAGNOSIS — J45909 Unspecified asthma, uncomplicated: Secondary | ICD-10-CM | POA: Diagnosis not present

## 2020-08-04 DIAGNOSIS — F17210 Nicotine dependence, cigarettes, uncomplicated: Secondary | ICD-10-CM | POA: Diagnosis not present

## 2020-08-04 DIAGNOSIS — M2469 Ankylosis, other specified joint: Secondary | ICD-10-CM | POA: Diagnosis not present

## 2020-08-04 DIAGNOSIS — S14129D Central cord syndrome at unspecified level of cervical spinal cord, subsequent encounter: Secondary | ICD-10-CM | POA: Diagnosis not present

## 2020-08-04 DIAGNOSIS — M4802 Spinal stenosis, cervical region: Secondary | ICD-10-CM | POA: Diagnosis not present

## 2020-08-04 DIAGNOSIS — M4712 Other spondylosis with myelopathy, cervical region: Secondary | ICD-10-CM | POA: Diagnosis not present

## 2020-08-04 DIAGNOSIS — F419 Anxiety disorder, unspecified: Secondary | ICD-10-CM | POA: Diagnosis not present

## 2020-08-04 DIAGNOSIS — M25561 Pain in right knee: Secondary | ICD-10-CM | POA: Diagnosis not present

## 2020-08-06 DIAGNOSIS — F17210 Nicotine dependence, cigarettes, uncomplicated: Secondary | ICD-10-CM | POA: Diagnosis not present

## 2020-08-06 DIAGNOSIS — M25561 Pain in right knee: Secondary | ICD-10-CM | POA: Diagnosis not present

## 2020-08-06 DIAGNOSIS — S14129D Central cord syndrome at unspecified level of cervical spinal cord, subsequent encounter: Secondary | ICD-10-CM | POA: Diagnosis not present

## 2020-08-06 DIAGNOSIS — M2469 Ankylosis, other specified joint: Secondary | ICD-10-CM | POA: Diagnosis not present

## 2020-08-06 DIAGNOSIS — F329 Major depressive disorder, single episode, unspecified: Secondary | ICD-10-CM | POA: Diagnosis not present

## 2020-08-06 DIAGNOSIS — M4712 Other spondylosis with myelopathy, cervical region: Secondary | ICD-10-CM | POA: Diagnosis not present

## 2020-08-06 DIAGNOSIS — J45909 Unspecified asthma, uncomplicated: Secondary | ICD-10-CM | POA: Diagnosis not present

## 2020-08-06 DIAGNOSIS — F419 Anxiety disorder, unspecified: Secondary | ICD-10-CM | POA: Diagnosis not present

## 2020-08-06 DIAGNOSIS — M4802 Spinal stenosis, cervical region: Secondary | ICD-10-CM | POA: Diagnosis not present

## 2020-08-11 DIAGNOSIS — M2469 Ankylosis, other specified joint: Secondary | ICD-10-CM | POA: Diagnosis not present

## 2020-08-11 DIAGNOSIS — F419 Anxiety disorder, unspecified: Secondary | ICD-10-CM | POA: Diagnosis not present

## 2020-08-11 DIAGNOSIS — M4802 Spinal stenosis, cervical region: Secondary | ICD-10-CM | POA: Diagnosis not present

## 2020-08-11 DIAGNOSIS — M4712 Other spondylosis with myelopathy, cervical region: Secondary | ICD-10-CM | POA: Diagnosis not present

## 2020-08-11 DIAGNOSIS — J45909 Unspecified asthma, uncomplicated: Secondary | ICD-10-CM | POA: Diagnosis not present

## 2020-08-11 DIAGNOSIS — S14129D Central cord syndrome at unspecified level of cervical spinal cord, subsequent encounter: Secondary | ICD-10-CM | POA: Diagnosis not present

## 2020-08-11 DIAGNOSIS — F329 Major depressive disorder, single episode, unspecified: Secondary | ICD-10-CM | POA: Diagnosis not present

## 2020-08-11 DIAGNOSIS — F17210 Nicotine dependence, cigarettes, uncomplicated: Secondary | ICD-10-CM | POA: Diagnosis not present

## 2020-08-11 DIAGNOSIS — M25561 Pain in right knee: Secondary | ICD-10-CM | POA: Diagnosis not present

## 2020-08-12 DIAGNOSIS — Z23 Encounter for immunization: Secondary | ICD-10-CM | POA: Diagnosis not present

## 2020-08-12 DIAGNOSIS — Z139 Encounter for screening, unspecified: Secondary | ICD-10-CM | POA: Diagnosis not present

## 2020-08-12 DIAGNOSIS — J449 Chronic obstructive pulmonary disease, unspecified: Secondary | ICD-10-CM | POA: Diagnosis not present

## 2020-08-12 DIAGNOSIS — F339 Major depressive disorder, recurrent, unspecified: Secondary | ICD-10-CM | POA: Diagnosis not present

## 2020-08-12 DIAGNOSIS — M1990 Unspecified osteoarthritis, unspecified site: Secondary | ICD-10-CM | POA: Diagnosis not present

## 2020-08-12 DIAGNOSIS — F419 Anxiety disorder, unspecified: Secondary | ICD-10-CM | POA: Diagnosis not present

## 2020-08-12 DIAGNOSIS — Z6820 Body mass index (BMI) 20.0-20.9, adult: Secondary | ICD-10-CM | POA: Diagnosis not present

## 2020-08-13 DIAGNOSIS — S14129D Central cord syndrome at unspecified level of cervical spinal cord, subsequent encounter: Secondary | ICD-10-CM | POA: Diagnosis not present

## 2020-08-13 DIAGNOSIS — F419 Anxiety disorder, unspecified: Secondary | ICD-10-CM | POA: Diagnosis not present

## 2020-08-13 DIAGNOSIS — F17210 Nicotine dependence, cigarettes, uncomplicated: Secondary | ICD-10-CM | POA: Diagnosis not present

## 2020-08-13 DIAGNOSIS — M4802 Spinal stenosis, cervical region: Secondary | ICD-10-CM | POA: Diagnosis not present

## 2020-08-13 DIAGNOSIS — M4712 Other spondylosis with myelopathy, cervical region: Secondary | ICD-10-CM | POA: Diagnosis not present

## 2020-08-13 DIAGNOSIS — F329 Major depressive disorder, single episode, unspecified: Secondary | ICD-10-CM | POA: Diagnosis not present

## 2020-08-13 DIAGNOSIS — M25561 Pain in right knee: Secondary | ICD-10-CM | POA: Diagnosis not present

## 2020-08-13 DIAGNOSIS — J45909 Unspecified asthma, uncomplicated: Secondary | ICD-10-CM | POA: Diagnosis not present

## 2020-08-13 DIAGNOSIS — M2469 Ankylosis, other specified joint: Secondary | ICD-10-CM | POA: Diagnosis not present

## 2020-08-16 DIAGNOSIS — F17210 Nicotine dependence, cigarettes, uncomplicated: Secondary | ICD-10-CM | POA: Diagnosis not present

## 2020-08-16 DIAGNOSIS — F329 Major depressive disorder, single episode, unspecified: Secondary | ICD-10-CM | POA: Diagnosis not present

## 2020-08-16 DIAGNOSIS — F419 Anxiety disorder, unspecified: Secondary | ICD-10-CM | POA: Diagnosis not present

## 2020-08-16 DIAGNOSIS — M4802 Spinal stenosis, cervical region: Secondary | ICD-10-CM | POA: Diagnosis not present

## 2020-08-16 DIAGNOSIS — M25561 Pain in right knee: Secondary | ICD-10-CM | POA: Diagnosis not present

## 2020-08-16 DIAGNOSIS — M4712 Other spondylosis with myelopathy, cervical region: Secondary | ICD-10-CM | POA: Diagnosis not present

## 2020-08-16 DIAGNOSIS — M2469 Ankylosis, other specified joint: Secondary | ICD-10-CM | POA: Diagnosis not present

## 2020-08-16 DIAGNOSIS — S14129D Central cord syndrome at unspecified level of cervical spinal cord, subsequent encounter: Secondary | ICD-10-CM | POA: Diagnosis not present

## 2020-08-16 DIAGNOSIS — J45909 Unspecified asthma, uncomplicated: Secondary | ICD-10-CM | POA: Diagnosis not present

## 2020-08-19 DIAGNOSIS — M25561 Pain in right knee: Secondary | ICD-10-CM | POA: Diagnosis not present

## 2020-08-19 DIAGNOSIS — M2469 Ankylosis, other specified joint: Secondary | ICD-10-CM | POA: Diagnosis not present

## 2020-08-19 DIAGNOSIS — S14129D Central cord syndrome at unspecified level of cervical spinal cord, subsequent encounter: Secondary | ICD-10-CM | POA: Diagnosis not present

## 2020-08-19 DIAGNOSIS — F17210 Nicotine dependence, cigarettes, uncomplicated: Secondary | ICD-10-CM | POA: Diagnosis not present

## 2020-08-19 DIAGNOSIS — F419 Anxiety disorder, unspecified: Secondary | ICD-10-CM | POA: Diagnosis not present

## 2020-08-19 DIAGNOSIS — F329 Major depressive disorder, single episode, unspecified: Secondary | ICD-10-CM | POA: Diagnosis not present

## 2020-08-19 DIAGNOSIS — M4802 Spinal stenosis, cervical region: Secondary | ICD-10-CM | POA: Diagnosis not present

## 2020-08-19 DIAGNOSIS — J45909 Unspecified asthma, uncomplicated: Secondary | ICD-10-CM | POA: Diagnosis not present

## 2020-08-19 DIAGNOSIS — M4712 Other spondylosis with myelopathy, cervical region: Secondary | ICD-10-CM | POA: Diagnosis not present

## 2020-08-23 DIAGNOSIS — M25561 Pain in right knee: Secondary | ICD-10-CM | POA: Diagnosis not present

## 2020-08-23 DIAGNOSIS — S14129D Central cord syndrome at unspecified level of cervical spinal cord, subsequent encounter: Secondary | ICD-10-CM | POA: Diagnosis not present

## 2020-08-23 DIAGNOSIS — M4712 Other spondylosis with myelopathy, cervical region: Secondary | ICD-10-CM | POA: Diagnosis not present

## 2020-08-23 DIAGNOSIS — M4802 Spinal stenosis, cervical region: Secondary | ICD-10-CM | POA: Diagnosis not present

## 2020-08-23 DIAGNOSIS — J45909 Unspecified asthma, uncomplicated: Secondary | ICD-10-CM | POA: Diagnosis not present

## 2020-08-23 DIAGNOSIS — F419 Anxiety disorder, unspecified: Secondary | ICD-10-CM | POA: Diagnosis not present

## 2020-08-23 DIAGNOSIS — F329 Major depressive disorder, single episode, unspecified: Secondary | ICD-10-CM | POA: Diagnosis not present

## 2020-08-23 DIAGNOSIS — M2469 Ankylosis, other specified joint: Secondary | ICD-10-CM | POA: Diagnosis not present

## 2020-08-23 DIAGNOSIS — F17210 Nicotine dependence, cigarettes, uncomplicated: Secondary | ICD-10-CM | POA: Diagnosis not present

## 2020-08-24 DIAGNOSIS — M4802 Spinal stenosis, cervical region: Secondary | ICD-10-CM | POA: Diagnosis not present

## 2020-08-24 DIAGNOSIS — J45909 Unspecified asthma, uncomplicated: Secondary | ICD-10-CM | POA: Diagnosis not present

## 2020-08-24 DIAGNOSIS — F329 Major depressive disorder, single episode, unspecified: Secondary | ICD-10-CM | POA: Diagnosis not present

## 2020-08-24 DIAGNOSIS — F419 Anxiety disorder, unspecified: Secondary | ICD-10-CM | POA: Diagnosis not present

## 2020-08-24 DIAGNOSIS — M4712 Other spondylosis with myelopathy, cervical region: Secondary | ICD-10-CM | POA: Diagnosis not present

## 2020-08-24 DIAGNOSIS — M25561 Pain in right knee: Secondary | ICD-10-CM | POA: Diagnosis not present

## 2020-08-24 DIAGNOSIS — F17210 Nicotine dependence, cigarettes, uncomplicated: Secondary | ICD-10-CM | POA: Diagnosis not present

## 2020-08-24 DIAGNOSIS — M2469 Ankylosis, other specified joint: Secondary | ICD-10-CM | POA: Diagnosis not present

## 2020-08-24 DIAGNOSIS — S14129D Central cord syndrome at unspecified level of cervical spinal cord, subsequent encounter: Secondary | ICD-10-CM | POA: Diagnosis not present

## 2020-08-26 DIAGNOSIS — F419 Anxiety disorder, unspecified: Secondary | ICD-10-CM | POA: Diagnosis not present

## 2020-08-26 DIAGNOSIS — M2469 Ankylosis, other specified joint: Secondary | ICD-10-CM | POA: Diagnosis not present

## 2020-08-26 DIAGNOSIS — M4712 Other spondylosis with myelopathy, cervical region: Secondary | ICD-10-CM | POA: Diagnosis not present

## 2020-08-26 DIAGNOSIS — M25561 Pain in right knee: Secondary | ICD-10-CM | POA: Diagnosis not present

## 2020-08-26 DIAGNOSIS — M4802 Spinal stenosis, cervical region: Secondary | ICD-10-CM | POA: Diagnosis not present

## 2020-08-26 DIAGNOSIS — J45909 Unspecified asthma, uncomplicated: Secondary | ICD-10-CM | POA: Diagnosis not present

## 2020-08-26 DIAGNOSIS — F17210 Nicotine dependence, cigarettes, uncomplicated: Secondary | ICD-10-CM | POA: Diagnosis not present

## 2020-08-26 DIAGNOSIS — F329 Major depressive disorder, single episode, unspecified: Secondary | ICD-10-CM | POA: Diagnosis not present

## 2020-08-26 DIAGNOSIS — S14129D Central cord syndrome at unspecified level of cervical spinal cord, subsequent encounter: Secondary | ICD-10-CM | POA: Diagnosis not present

## 2020-09-01 DIAGNOSIS — M4712 Other spondylosis with myelopathy, cervical region: Secondary | ICD-10-CM | POA: Diagnosis not present

## 2020-09-01 DIAGNOSIS — F17210 Nicotine dependence, cigarettes, uncomplicated: Secondary | ICD-10-CM | POA: Diagnosis not present

## 2020-09-01 DIAGNOSIS — F419 Anxiety disorder, unspecified: Secondary | ICD-10-CM | POA: Diagnosis not present

## 2020-09-01 DIAGNOSIS — M4802 Spinal stenosis, cervical region: Secondary | ICD-10-CM | POA: Diagnosis not present

## 2020-09-01 DIAGNOSIS — F329 Major depressive disorder, single episode, unspecified: Secondary | ICD-10-CM | POA: Diagnosis not present

## 2020-09-01 DIAGNOSIS — J45909 Unspecified asthma, uncomplicated: Secondary | ICD-10-CM | POA: Diagnosis not present

## 2020-09-01 DIAGNOSIS — S14129D Central cord syndrome at unspecified level of cervical spinal cord, subsequent encounter: Secondary | ICD-10-CM | POA: Diagnosis not present

## 2020-09-01 DIAGNOSIS — M25561 Pain in right knee: Secondary | ICD-10-CM | POA: Diagnosis not present

## 2020-09-01 DIAGNOSIS — M2469 Ankylosis, other specified joint: Secondary | ICD-10-CM | POA: Diagnosis not present

## 2020-09-08 DIAGNOSIS — F419 Anxiety disorder, unspecified: Secondary | ICD-10-CM | POA: Diagnosis not present

## 2020-09-08 DIAGNOSIS — M4712 Other spondylosis with myelopathy, cervical region: Secondary | ICD-10-CM | POA: Diagnosis not present

## 2020-09-08 DIAGNOSIS — F329 Major depressive disorder, single episode, unspecified: Secondary | ICD-10-CM | POA: Diagnosis not present

## 2020-09-08 DIAGNOSIS — M4802 Spinal stenosis, cervical region: Secondary | ICD-10-CM | POA: Diagnosis not present

## 2020-09-08 DIAGNOSIS — F17210 Nicotine dependence, cigarettes, uncomplicated: Secondary | ICD-10-CM | POA: Diagnosis not present

## 2020-09-08 DIAGNOSIS — M25561 Pain in right knee: Secondary | ICD-10-CM | POA: Diagnosis not present

## 2020-09-08 DIAGNOSIS — S14129D Central cord syndrome at unspecified level of cervical spinal cord, subsequent encounter: Secondary | ICD-10-CM | POA: Diagnosis not present

## 2020-09-08 DIAGNOSIS — M2469 Ankylosis, other specified joint: Secondary | ICD-10-CM | POA: Diagnosis not present

## 2020-09-08 DIAGNOSIS — J45909 Unspecified asthma, uncomplicated: Secondary | ICD-10-CM | POA: Diagnosis not present

## 2020-09-15 DIAGNOSIS — F419 Anxiety disorder, unspecified: Secondary | ICD-10-CM | POA: Diagnosis not present

## 2020-09-15 DIAGNOSIS — S14129D Central cord syndrome at unspecified level of cervical spinal cord, subsequent encounter: Secondary | ICD-10-CM | POA: Diagnosis not present

## 2020-09-15 DIAGNOSIS — M4802 Spinal stenosis, cervical region: Secondary | ICD-10-CM | POA: Diagnosis not present

## 2020-09-15 DIAGNOSIS — F17210 Nicotine dependence, cigarettes, uncomplicated: Secondary | ICD-10-CM | POA: Diagnosis not present

## 2020-09-15 DIAGNOSIS — F329 Major depressive disorder, single episode, unspecified: Secondary | ICD-10-CM | POA: Diagnosis not present

## 2020-09-15 DIAGNOSIS — M2469 Ankylosis, other specified joint: Secondary | ICD-10-CM | POA: Diagnosis not present

## 2020-09-15 DIAGNOSIS — J45909 Unspecified asthma, uncomplicated: Secondary | ICD-10-CM | POA: Diagnosis not present

## 2020-09-15 DIAGNOSIS — M25561 Pain in right knee: Secondary | ICD-10-CM | POA: Diagnosis not present

## 2020-09-15 DIAGNOSIS — M4712 Other spondylosis with myelopathy, cervical region: Secondary | ICD-10-CM | POA: Diagnosis not present

## 2020-10-21 DIAGNOSIS — H748X1 Other specified disorders of right middle ear and mastoid: Secondary | ICD-10-CM | POA: Diagnosis not present

## 2020-10-21 DIAGNOSIS — R0902 Hypoxemia: Secondary | ICD-10-CM | POA: Diagnosis not present

## 2020-10-21 DIAGNOSIS — I6782 Cerebral ischemia: Secondary | ICD-10-CM | POA: Diagnosis not present

## 2020-10-21 DIAGNOSIS — F13239 Sedative, hypnotic or anxiolytic dependence with withdrawal, unspecified: Secondary | ICD-10-CM | POA: Diagnosis not present

## 2020-10-21 DIAGNOSIS — R404 Transient alteration of awareness: Secondary | ICD-10-CM | POA: Diagnosis not present

## 2020-10-21 DIAGNOSIS — J449 Chronic obstructive pulmonary disease, unspecified: Secondary | ICD-10-CM | POA: Diagnosis not present

## 2020-10-21 DIAGNOSIS — M4802 Spinal stenosis, cervical region: Secondary | ICD-10-CM | POA: Diagnosis not present

## 2020-10-21 DIAGNOSIS — R9431 Abnormal electrocardiogram [ECG] [EKG]: Secondary | ICD-10-CM | POA: Diagnosis not present

## 2020-10-21 DIAGNOSIS — I10 Essential (primary) hypertension: Secondary | ICD-10-CM | POA: Diagnosis not present

## 2020-10-21 DIAGNOSIS — F410 Panic disorder [episodic paroxysmal anxiety] without agoraphobia: Secondary | ICD-10-CM | POA: Diagnosis not present

## 2020-10-21 DIAGNOSIS — R4182 Altered mental status, unspecified: Secondary | ICD-10-CM | POA: Diagnosis not present

## 2020-10-21 DIAGNOSIS — M25551 Pain in right hip: Secondary | ICD-10-CM | POA: Diagnosis not present

## 2020-10-21 DIAGNOSIS — I491 Atrial premature depolarization: Secondary | ICD-10-CM | POA: Diagnosis not present

## 2020-10-21 DIAGNOSIS — R410 Disorientation, unspecified: Secondary | ICD-10-CM | POA: Diagnosis not present

## 2020-10-21 DIAGNOSIS — F1010 Alcohol abuse, uncomplicated: Secondary | ICD-10-CM | POA: Diagnosis not present

## 2020-10-21 DIAGNOSIS — R569 Unspecified convulsions: Secondary | ICD-10-CM | POA: Diagnosis not present

## 2020-10-21 DIAGNOSIS — G40909 Epilepsy, unspecified, not intractable, without status epilepticus: Secondary | ICD-10-CM | POA: Diagnosis not present

## 2020-10-21 DIAGNOSIS — Z9981 Dependence on supplemental oxygen: Secondary | ICD-10-CM | POA: Diagnosis not present

## 2020-10-21 DIAGNOSIS — G319 Degenerative disease of nervous system, unspecified: Secondary | ICD-10-CM | POA: Diagnosis not present

## 2020-10-21 DIAGNOSIS — F1011 Alcohol abuse, in remission: Secondary | ICD-10-CM | POA: Diagnosis not present

## 2020-10-21 DIAGNOSIS — F1020 Alcohol dependence, uncomplicated: Secondary | ICD-10-CM | POA: Diagnosis not present

## 2020-10-21 DIAGNOSIS — F1910 Other psychoactive substance abuse, uncomplicated: Secondary | ICD-10-CM | POA: Diagnosis not present

## 2020-10-21 DIAGNOSIS — G40901 Epilepsy, unspecified, not intractable, with status epilepticus: Secondary | ICD-10-CM | POA: Diagnosis not present

## 2020-10-21 DIAGNOSIS — F17210 Nicotine dependence, cigarettes, uncomplicated: Secondary | ICD-10-CM | POA: Diagnosis not present

## 2020-10-21 DIAGNOSIS — R251 Tremor, unspecified: Secondary | ICD-10-CM | POA: Diagnosis not present

## 2020-10-22 DIAGNOSIS — R569 Unspecified convulsions: Secondary | ICD-10-CM | POA: Diagnosis not present

## 2020-10-28 DIAGNOSIS — I10 Essential (primary) hypertension: Secondary | ICD-10-CM | POA: Diagnosis not present

## 2020-10-28 DIAGNOSIS — G40909 Epilepsy, unspecified, not intractable, without status epilepticus: Secondary | ICD-10-CM | POA: Diagnosis not present

## 2020-10-28 DIAGNOSIS — Q069 Congenital malformation of spinal cord, unspecified: Secondary | ICD-10-CM | POA: Diagnosis not present

## 2020-10-28 DIAGNOSIS — F40240 Claustrophobia: Secondary | ICD-10-CM | POA: Diagnosis not present

## 2020-10-28 DIAGNOSIS — F410 Panic disorder [episodic paroxysmal anxiety] without agoraphobia: Secondary | ICD-10-CM | POA: Diagnosis not present

## 2020-10-28 DIAGNOSIS — M1990 Unspecified osteoarthritis, unspecified site: Secondary | ICD-10-CM | POA: Diagnosis not present

## 2020-10-28 DIAGNOSIS — F17210 Nicotine dependence, cigarettes, uncomplicated: Secondary | ICD-10-CM | POA: Diagnosis not present

## 2020-10-28 DIAGNOSIS — J449 Chronic obstructive pulmonary disease, unspecified: Secondary | ICD-10-CM | POA: Diagnosis not present

## 2020-10-28 DIAGNOSIS — M4712 Other spondylosis with myelopathy, cervical region: Secondary | ICD-10-CM | POA: Diagnosis not present

## 2020-11-01 DIAGNOSIS — F419 Anxiety disorder, unspecified: Secondary | ICD-10-CM | POA: Diagnosis not present

## 2020-11-01 DIAGNOSIS — R569 Unspecified convulsions: Secondary | ICD-10-CM | POA: Diagnosis not present

## 2020-11-01 DIAGNOSIS — Z6820 Body mass index (BMI) 20.0-20.9, adult: Secondary | ICD-10-CM | POA: Diagnosis not present

## 2020-11-01 DIAGNOSIS — Z79899 Other long term (current) drug therapy: Secondary | ICD-10-CM | POA: Diagnosis not present

## 2020-11-01 DIAGNOSIS — J449 Chronic obstructive pulmonary disease, unspecified: Secondary | ICD-10-CM | POA: Diagnosis not present

## 2020-11-01 DIAGNOSIS — F1010 Alcohol abuse, uncomplicated: Secondary | ICD-10-CM | POA: Diagnosis not present

## 2020-11-05 DIAGNOSIS — M1990 Unspecified osteoarthritis, unspecified site: Secondary | ICD-10-CM | POA: Diagnosis not present

## 2020-11-05 DIAGNOSIS — Q069 Congenital malformation of spinal cord, unspecified: Secondary | ICD-10-CM | POA: Diagnosis not present

## 2020-11-05 DIAGNOSIS — M4712 Other spondylosis with myelopathy, cervical region: Secondary | ICD-10-CM | POA: Diagnosis not present

## 2020-11-05 DIAGNOSIS — J449 Chronic obstructive pulmonary disease, unspecified: Secondary | ICD-10-CM | POA: Diagnosis not present

## 2020-11-05 DIAGNOSIS — G40909 Epilepsy, unspecified, not intractable, without status epilepticus: Secondary | ICD-10-CM | POA: Diagnosis not present

## 2020-11-05 DIAGNOSIS — F410 Panic disorder [episodic paroxysmal anxiety] without agoraphobia: Secondary | ICD-10-CM | POA: Diagnosis not present

## 2020-11-05 DIAGNOSIS — F17210 Nicotine dependence, cigarettes, uncomplicated: Secondary | ICD-10-CM | POA: Diagnosis not present

## 2020-11-05 DIAGNOSIS — F40240 Claustrophobia: Secondary | ICD-10-CM | POA: Diagnosis not present

## 2020-11-05 DIAGNOSIS — I10 Essential (primary) hypertension: Secondary | ICD-10-CM | POA: Diagnosis not present

## 2020-11-08 DIAGNOSIS — F410 Panic disorder [episodic paroxysmal anxiety] without agoraphobia: Secondary | ICD-10-CM | POA: Diagnosis not present

## 2020-11-08 DIAGNOSIS — G40909 Epilepsy, unspecified, not intractable, without status epilepticus: Secondary | ICD-10-CM | POA: Diagnosis not present

## 2020-11-08 DIAGNOSIS — I10 Essential (primary) hypertension: Secondary | ICD-10-CM | POA: Diagnosis not present

## 2020-11-08 DIAGNOSIS — M4712 Other spondylosis with myelopathy, cervical region: Secondary | ICD-10-CM | POA: Diagnosis not present

## 2020-11-08 DIAGNOSIS — M1990 Unspecified osteoarthritis, unspecified site: Secondary | ICD-10-CM | POA: Diagnosis not present

## 2020-11-08 DIAGNOSIS — Q069 Congenital malformation of spinal cord, unspecified: Secondary | ICD-10-CM | POA: Diagnosis not present

## 2020-11-08 DIAGNOSIS — J449 Chronic obstructive pulmonary disease, unspecified: Secondary | ICD-10-CM | POA: Diagnosis not present

## 2020-11-08 DIAGNOSIS — F40240 Claustrophobia: Secondary | ICD-10-CM | POA: Diagnosis not present

## 2020-11-08 DIAGNOSIS — F17210 Nicotine dependence, cigarettes, uncomplicated: Secondary | ICD-10-CM | POA: Diagnosis not present

## 2020-11-09 DIAGNOSIS — R569 Unspecified convulsions: Secondary | ICD-10-CM | POA: Diagnosis not present

## 2020-11-11 DIAGNOSIS — G40909 Epilepsy, unspecified, not intractable, without status epilepticus: Secondary | ICD-10-CM | POA: Diagnosis not present

## 2020-11-11 DIAGNOSIS — F410 Panic disorder [episodic paroxysmal anxiety] without agoraphobia: Secondary | ICD-10-CM | POA: Diagnosis not present

## 2020-11-11 DIAGNOSIS — J449 Chronic obstructive pulmonary disease, unspecified: Secondary | ICD-10-CM | POA: Diagnosis not present

## 2020-11-11 DIAGNOSIS — F40240 Claustrophobia: Secondary | ICD-10-CM | POA: Diagnosis not present

## 2020-11-11 DIAGNOSIS — Q069 Congenital malformation of spinal cord, unspecified: Secondary | ICD-10-CM | POA: Diagnosis not present

## 2020-11-11 DIAGNOSIS — F17210 Nicotine dependence, cigarettes, uncomplicated: Secondary | ICD-10-CM | POA: Diagnosis not present

## 2020-11-11 DIAGNOSIS — I10 Essential (primary) hypertension: Secondary | ICD-10-CM | POA: Diagnosis not present

## 2020-11-11 DIAGNOSIS — M4712 Other spondylosis with myelopathy, cervical region: Secondary | ICD-10-CM | POA: Diagnosis not present

## 2020-11-11 DIAGNOSIS — M1990 Unspecified osteoarthritis, unspecified site: Secondary | ICD-10-CM | POA: Diagnosis not present

## 2020-11-12 DIAGNOSIS — J449 Chronic obstructive pulmonary disease, unspecified: Secondary | ICD-10-CM | POA: Diagnosis not present

## 2020-11-12 DIAGNOSIS — Z1211 Encounter for screening for malignant neoplasm of colon: Secondary | ICD-10-CM | POA: Diagnosis not present

## 2020-11-12 DIAGNOSIS — E785 Hyperlipidemia, unspecified: Secondary | ICD-10-CM | POA: Diagnosis not present

## 2020-11-12 DIAGNOSIS — F419 Anxiety disorder, unspecified: Secondary | ICD-10-CM | POA: Diagnosis not present

## 2020-11-12 DIAGNOSIS — Z6820 Body mass index (BMI) 20.0-20.9, adult: Secondary | ICD-10-CM | POA: Diagnosis not present

## 2020-11-12 DIAGNOSIS — Z125 Encounter for screening for malignant neoplasm of prostate: Secondary | ICD-10-CM | POA: Diagnosis not present

## 2020-11-12 DIAGNOSIS — M109 Gout, unspecified: Secondary | ICD-10-CM | POA: Diagnosis not present

## 2020-11-12 DIAGNOSIS — R569 Unspecified convulsions: Secondary | ICD-10-CM | POA: Diagnosis not present

## 2020-11-12 DIAGNOSIS — Z79899 Other long term (current) drug therapy: Secondary | ICD-10-CM | POA: Diagnosis not present

## 2020-11-15 DIAGNOSIS — F40240 Claustrophobia: Secondary | ICD-10-CM | POA: Diagnosis not present

## 2020-11-15 DIAGNOSIS — F410 Panic disorder [episodic paroxysmal anxiety] without agoraphobia: Secondary | ICD-10-CM | POA: Diagnosis not present

## 2020-11-15 DIAGNOSIS — Q069 Congenital malformation of spinal cord, unspecified: Secondary | ICD-10-CM | POA: Diagnosis not present

## 2020-11-15 DIAGNOSIS — M1990 Unspecified osteoarthritis, unspecified site: Secondary | ICD-10-CM | POA: Diagnosis not present

## 2020-11-15 DIAGNOSIS — J449 Chronic obstructive pulmonary disease, unspecified: Secondary | ICD-10-CM | POA: Diagnosis not present

## 2020-11-15 DIAGNOSIS — M4712 Other spondylosis with myelopathy, cervical region: Secondary | ICD-10-CM | POA: Diagnosis not present

## 2020-11-15 DIAGNOSIS — F17210 Nicotine dependence, cigarettes, uncomplicated: Secondary | ICD-10-CM | POA: Diagnosis not present

## 2020-11-15 DIAGNOSIS — I10 Essential (primary) hypertension: Secondary | ICD-10-CM | POA: Diagnosis not present

## 2020-11-15 DIAGNOSIS — G40909 Epilepsy, unspecified, not intractable, without status epilepticus: Secondary | ICD-10-CM | POA: Diagnosis not present

## 2020-11-18 DIAGNOSIS — F410 Panic disorder [episodic paroxysmal anxiety] without agoraphobia: Secondary | ICD-10-CM | POA: Diagnosis not present

## 2020-11-18 DIAGNOSIS — Q069 Congenital malformation of spinal cord, unspecified: Secondary | ICD-10-CM | POA: Diagnosis not present

## 2020-11-18 DIAGNOSIS — M1990 Unspecified osteoarthritis, unspecified site: Secondary | ICD-10-CM | POA: Diagnosis not present

## 2020-11-18 DIAGNOSIS — F17210 Nicotine dependence, cigarettes, uncomplicated: Secondary | ICD-10-CM | POA: Diagnosis not present

## 2020-11-18 DIAGNOSIS — F40240 Claustrophobia: Secondary | ICD-10-CM | POA: Diagnosis not present

## 2020-11-18 DIAGNOSIS — G40909 Epilepsy, unspecified, not intractable, without status epilepticus: Secondary | ICD-10-CM | POA: Diagnosis not present

## 2020-11-18 DIAGNOSIS — J449 Chronic obstructive pulmonary disease, unspecified: Secondary | ICD-10-CM | POA: Diagnosis not present

## 2020-11-18 DIAGNOSIS — M4712 Other spondylosis with myelopathy, cervical region: Secondary | ICD-10-CM | POA: Diagnosis not present

## 2020-11-18 DIAGNOSIS — I10 Essential (primary) hypertension: Secondary | ICD-10-CM | POA: Diagnosis not present

## 2020-11-25 DIAGNOSIS — G40909 Epilepsy, unspecified, not intractable, without status epilepticus: Secondary | ICD-10-CM | POA: Diagnosis not present

## 2020-11-25 DIAGNOSIS — Q069 Congenital malformation of spinal cord, unspecified: Secondary | ICD-10-CM | POA: Diagnosis not present

## 2020-11-25 DIAGNOSIS — M4712 Other spondylosis with myelopathy, cervical region: Secondary | ICD-10-CM | POA: Diagnosis not present

## 2020-11-25 DIAGNOSIS — M1990 Unspecified osteoarthritis, unspecified site: Secondary | ICD-10-CM | POA: Diagnosis not present

## 2020-11-25 DIAGNOSIS — F410 Panic disorder [episodic paroxysmal anxiety] without agoraphobia: Secondary | ICD-10-CM | POA: Diagnosis not present

## 2020-11-25 DIAGNOSIS — F17210 Nicotine dependence, cigarettes, uncomplicated: Secondary | ICD-10-CM | POA: Diagnosis not present

## 2020-11-25 DIAGNOSIS — J449 Chronic obstructive pulmonary disease, unspecified: Secondary | ICD-10-CM | POA: Diagnosis not present

## 2020-11-25 DIAGNOSIS — I10 Essential (primary) hypertension: Secondary | ICD-10-CM | POA: Diagnosis not present

## 2020-11-25 DIAGNOSIS — F40240 Claustrophobia: Secondary | ICD-10-CM | POA: Diagnosis not present

## 2020-12-02 DIAGNOSIS — I10 Essential (primary) hypertension: Secondary | ICD-10-CM | POA: Diagnosis not present

## 2020-12-02 DIAGNOSIS — M4712 Other spondylosis with myelopathy, cervical region: Secondary | ICD-10-CM | POA: Diagnosis not present

## 2020-12-02 DIAGNOSIS — Q069 Congenital malformation of spinal cord, unspecified: Secondary | ICD-10-CM | POA: Diagnosis not present

## 2020-12-02 DIAGNOSIS — M1990 Unspecified osteoarthritis, unspecified site: Secondary | ICD-10-CM | POA: Diagnosis not present

## 2020-12-02 DIAGNOSIS — J449 Chronic obstructive pulmonary disease, unspecified: Secondary | ICD-10-CM | POA: Diagnosis not present

## 2020-12-02 DIAGNOSIS — G40909 Epilepsy, unspecified, not intractable, without status epilepticus: Secondary | ICD-10-CM | POA: Diagnosis not present

## 2020-12-02 DIAGNOSIS — F410 Panic disorder [episodic paroxysmal anxiety] without agoraphobia: Secondary | ICD-10-CM | POA: Diagnosis not present

## 2020-12-02 DIAGNOSIS — F17210 Nicotine dependence, cigarettes, uncomplicated: Secondary | ICD-10-CM | POA: Diagnosis not present

## 2020-12-02 DIAGNOSIS — F40240 Claustrophobia: Secondary | ICD-10-CM | POA: Diagnosis not present

## 2020-12-06 DIAGNOSIS — G40909 Epilepsy, unspecified, not intractable, without status epilepticus: Secondary | ICD-10-CM | POA: Diagnosis not present

## 2020-12-06 DIAGNOSIS — F17210 Nicotine dependence, cigarettes, uncomplicated: Secondary | ICD-10-CM | POA: Diagnosis not present

## 2020-12-06 DIAGNOSIS — F40240 Claustrophobia: Secondary | ICD-10-CM | POA: Diagnosis not present

## 2020-12-06 DIAGNOSIS — I10 Essential (primary) hypertension: Secondary | ICD-10-CM | POA: Diagnosis not present

## 2020-12-06 DIAGNOSIS — Q069 Congenital malformation of spinal cord, unspecified: Secondary | ICD-10-CM | POA: Diagnosis not present

## 2020-12-06 DIAGNOSIS — M4712 Other spondylosis with myelopathy, cervical region: Secondary | ICD-10-CM | POA: Diagnosis not present

## 2020-12-06 DIAGNOSIS — F410 Panic disorder [episodic paroxysmal anxiety] without agoraphobia: Secondary | ICD-10-CM | POA: Diagnosis not present

## 2020-12-06 DIAGNOSIS — J449 Chronic obstructive pulmonary disease, unspecified: Secondary | ICD-10-CM | POA: Diagnosis not present

## 2020-12-06 DIAGNOSIS — M1990 Unspecified osteoarthritis, unspecified site: Secondary | ICD-10-CM | POA: Diagnosis not present

## 2020-12-07 DIAGNOSIS — Z1211 Encounter for screening for malignant neoplasm of colon: Secondary | ICD-10-CM | POA: Diagnosis not present

## 2020-12-07 DIAGNOSIS — Z1212 Encounter for screening for malignant neoplasm of rectum: Secondary | ICD-10-CM | POA: Diagnosis not present

## 2021-05-07 DIAGNOSIS — R42 Dizziness and giddiness: Secondary | ICD-10-CM | POA: Diagnosis not present

## 2021-05-07 DIAGNOSIS — Z0130 Encounter for examination of blood pressure without abnormal findings: Secondary | ICD-10-CM | POA: Diagnosis not present

## 2021-05-07 DIAGNOSIS — R079 Chest pain, unspecified: Secondary | ICD-10-CM | POA: Diagnosis not present

## 2021-05-08 DIAGNOSIS — R9431 Abnormal electrocardiogram [ECG] [EKG]: Secondary | ICD-10-CM | POA: Diagnosis not present

## 2021-05-17 DIAGNOSIS — J449 Chronic obstructive pulmonary disease, unspecified: Secondary | ICD-10-CM | POA: Diagnosis not present

## 2021-05-17 DIAGNOSIS — Z6821 Body mass index (BMI) 21.0-21.9, adult: Secondary | ICD-10-CM | POA: Diagnosis not present

## 2021-05-17 DIAGNOSIS — I700 Atherosclerosis of aorta: Secondary | ICD-10-CM | POA: Diagnosis not present

## 2021-05-17 DIAGNOSIS — I739 Peripheral vascular disease, unspecified: Secondary | ICD-10-CM | POA: Diagnosis not present

## 2021-05-17 DIAGNOSIS — M109 Gout, unspecified: Secondary | ICD-10-CM | POA: Diagnosis not present

## 2021-05-17 DIAGNOSIS — Z79899 Other long term (current) drug therapy: Secondary | ICD-10-CM | POA: Diagnosis not present

## 2021-05-17 DIAGNOSIS — E785 Hyperlipidemia, unspecified: Secondary | ICD-10-CM | POA: Diagnosis not present

## 2021-05-17 DIAGNOSIS — Z87891 Personal history of nicotine dependence: Secondary | ICD-10-CM | POA: Diagnosis not present

## 2021-05-25 DIAGNOSIS — I739 Peripheral vascular disease, unspecified: Secondary | ICD-10-CM | POA: Diagnosis not present

## 2021-05-25 DIAGNOSIS — R200 Anesthesia of skin: Secondary | ICD-10-CM | POA: Diagnosis not present

## 2021-05-25 DIAGNOSIS — I70213 Atherosclerosis of native arteries of extremities with intermittent claudication, bilateral legs: Secondary | ICD-10-CM | POA: Diagnosis not present

## 2021-06-14 DIAGNOSIS — R569 Unspecified convulsions: Secondary | ICD-10-CM | POA: Diagnosis not present

## 2021-07-08 DIAGNOSIS — T63481A Toxic effect of venom of other arthropod, accidental (unintentional), initial encounter: Secondary | ICD-10-CM | POA: Diagnosis not present

## 2021-08-17 DIAGNOSIS — Z6821 Body mass index (BMI) 21.0-21.9, adult: Secondary | ICD-10-CM | POA: Diagnosis not present

## 2021-08-17 DIAGNOSIS — E785 Hyperlipidemia, unspecified: Secondary | ICD-10-CM | POA: Diagnosis not present

## 2021-08-17 DIAGNOSIS — Z79899 Other long term (current) drug therapy: Secondary | ICD-10-CM | POA: Diagnosis not present

## 2021-08-17 DIAGNOSIS — J449 Chronic obstructive pulmonary disease, unspecified: Secondary | ICD-10-CM | POA: Diagnosis not present

## 2021-08-17 DIAGNOSIS — M109 Gout, unspecified: Secondary | ICD-10-CM | POA: Diagnosis not present

## 2021-08-17 DIAGNOSIS — Z23 Encounter for immunization: Secondary | ICD-10-CM | POA: Diagnosis not present

## 2021-08-17 DIAGNOSIS — Z139 Encounter for screening, unspecified: Secondary | ICD-10-CM | POA: Diagnosis not present

## 2021-08-25 DIAGNOSIS — R21 Rash and other nonspecific skin eruption: Secondary | ICD-10-CM | POA: Diagnosis not present

## 2021-08-25 DIAGNOSIS — Z6821 Body mass index (BMI) 21.0-21.9, adult: Secondary | ICD-10-CM | POA: Diagnosis not present

## 2021-09-15 DIAGNOSIS — M1711 Unilateral primary osteoarthritis, right knee: Secondary | ICD-10-CM | POA: Diagnosis not present

## 2021-11-17 DIAGNOSIS — E785 Hyperlipidemia, unspecified: Secondary | ICD-10-CM | POA: Diagnosis not present

## 2021-11-17 DIAGNOSIS — I700 Atherosclerosis of aorta: Secondary | ICD-10-CM | POA: Diagnosis not present

## 2021-11-17 DIAGNOSIS — Z6821 Body mass index (BMI) 21.0-21.9, adult: Secondary | ICD-10-CM | POA: Diagnosis not present

## 2021-11-17 DIAGNOSIS — J449 Chronic obstructive pulmonary disease, unspecified: Secondary | ICD-10-CM | POA: Diagnosis not present

## 2021-11-17 DIAGNOSIS — M1990 Unspecified osteoarthritis, unspecified site: Secondary | ICD-10-CM | POA: Diagnosis not present

## 2021-12-15 DIAGNOSIS — R569 Unspecified convulsions: Secondary | ICD-10-CM | POA: Diagnosis not present

## 2021-12-20 DIAGNOSIS — M25561 Pain in right knee: Secondary | ICD-10-CM | POA: Diagnosis not present

## 2022-02-15 DIAGNOSIS — Z79899 Other long term (current) drug therapy: Secondary | ICD-10-CM | POA: Diagnosis not present

## 2022-02-15 DIAGNOSIS — Z136 Encounter for screening for cardiovascular disorders: Secondary | ICD-10-CM | POA: Diagnosis not present

## 2022-02-15 DIAGNOSIS — Z6822 Body mass index (BMI) 22.0-22.9, adult: Secondary | ICD-10-CM | POA: Diagnosis not present

## 2022-02-15 DIAGNOSIS — J449 Chronic obstructive pulmonary disease, unspecified: Secondary | ICD-10-CM | POA: Diagnosis not present

## 2022-02-15 DIAGNOSIS — M109 Gout, unspecified: Secondary | ICD-10-CM | POA: Diagnosis not present

## 2022-02-15 DIAGNOSIS — Z87891 Personal history of nicotine dependence: Secondary | ICD-10-CM | POA: Diagnosis not present

## 2022-02-15 DIAGNOSIS — E785 Hyperlipidemia, unspecified: Secondary | ICD-10-CM | POA: Diagnosis not present

## 2022-02-27 DIAGNOSIS — Z87891 Personal history of nicotine dependence: Secondary | ICD-10-CM | POA: Diagnosis not present

## 2022-02-27 DIAGNOSIS — Z136 Encounter for screening for cardiovascular disorders: Secondary | ICD-10-CM | POA: Diagnosis not present

## 2022-02-27 DIAGNOSIS — Z122 Encounter for screening for malignant neoplasm of respiratory organs: Secondary | ICD-10-CM | POA: Diagnosis not present

## 2022-02-27 DIAGNOSIS — F17210 Nicotine dependence, cigarettes, uncomplicated: Secondary | ICD-10-CM | POA: Diagnosis not present

## 2022-05-03 DIAGNOSIS — Z0000 Encounter for general adult medical examination without abnormal findings: Secondary | ICD-10-CM | POA: Diagnosis not present

## 2022-05-03 DIAGNOSIS — Z9181 History of falling: Secondary | ICD-10-CM | POA: Diagnosis not present

## 2022-05-03 DIAGNOSIS — E785 Hyperlipidemia, unspecified: Secondary | ICD-10-CM | POA: Diagnosis not present

## 2022-05-24 DIAGNOSIS — Z79899 Other long term (current) drug therapy: Secondary | ICD-10-CM | POA: Diagnosis not present

## 2022-05-24 DIAGNOSIS — M109 Gout, unspecified: Secondary | ICD-10-CM | POA: Diagnosis not present

## 2022-05-24 DIAGNOSIS — Z6821 Body mass index (BMI) 21.0-21.9, adult: Secondary | ICD-10-CM | POA: Diagnosis not present

## 2022-05-24 DIAGNOSIS — J449 Chronic obstructive pulmonary disease, unspecified: Secondary | ICD-10-CM | POA: Diagnosis not present

## 2022-05-24 DIAGNOSIS — E785 Hyperlipidemia, unspecified: Secondary | ICD-10-CM | POA: Diagnosis not present

## 2022-06-06 DIAGNOSIS — R42 Dizziness and giddiness: Secondary | ICD-10-CM | POA: Diagnosis not present

## 2022-06-06 DIAGNOSIS — R55 Syncope and collapse: Secondary | ICD-10-CM | POA: Diagnosis not present

## 2022-06-12 DIAGNOSIS — R569 Unspecified convulsions: Secondary | ICD-10-CM | POA: Diagnosis not present

## 2022-06-14 DIAGNOSIS — Z6820 Body mass index (BMI) 20.0-20.9, adult: Secondary | ICD-10-CM | POA: Diagnosis not present

## 2022-06-14 DIAGNOSIS — R42 Dizziness and giddiness: Secondary | ICD-10-CM | POA: Diagnosis not present

## 2022-06-14 DIAGNOSIS — R634 Abnormal weight loss: Secondary | ICD-10-CM | POA: Diagnosis not present

## 2022-06-14 DIAGNOSIS — H5789 Other specified disorders of eye and adnexa: Secondary | ICD-10-CM | POA: Diagnosis not present

## 2022-06-20 DIAGNOSIS — H35033 Hypertensive retinopathy, bilateral: Secondary | ICD-10-CM | POA: Diagnosis not present

## 2022-08-23 DIAGNOSIS — Z79899 Other long term (current) drug therapy: Secondary | ICD-10-CM | POA: Diagnosis not present

## 2022-08-23 DIAGNOSIS — E785 Hyperlipidemia, unspecified: Secondary | ICD-10-CM | POA: Diagnosis not present

## 2022-08-23 DIAGNOSIS — Z139 Encounter for screening, unspecified: Secondary | ICD-10-CM | POA: Diagnosis not present

## 2022-08-23 DIAGNOSIS — Z6820 Body mass index (BMI) 20.0-20.9, adult: Secondary | ICD-10-CM | POA: Diagnosis not present

## 2022-08-23 DIAGNOSIS — M109 Gout, unspecified: Secondary | ICD-10-CM | POA: Diagnosis not present

## 2022-08-23 DIAGNOSIS — Z23 Encounter for immunization: Secondary | ICD-10-CM | POA: Diagnosis not present

## 2022-10-13 DIAGNOSIS — H25813 Combined forms of age-related cataract, bilateral: Secondary | ICD-10-CM | POA: Diagnosis not present

## 2022-11-01 DIAGNOSIS — H25812 Combined forms of age-related cataract, left eye: Secondary | ICD-10-CM | POA: Diagnosis not present

## 2022-11-01 DIAGNOSIS — Z01818 Encounter for other preprocedural examination: Secondary | ICD-10-CM | POA: Diagnosis not present

## 2022-11-22 DIAGNOSIS — E785 Hyperlipidemia, unspecified: Secondary | ICD-10-CM | POA: Diagnosis not present

## 2022-11-22 DIAGNOSIS — I700 Atherosclerosis of aorta: Secondary | ICD-10-CM | POA: Diagnosis not present

## 2022-11-22 DIAGNOSIS — Z79899 Other long term (current) drug therapy: Secondary | ICD-10-CM | POA: Diagnosis not present

## 2022-11-22 DIAGNOSIS — M109 Gout, unspecified: Secondary | ICD-10-CM | POA: Diagnosis not present

## 2022-11-22 DIAGNOSIS — Z6820 Body mass index (BMI) 20.0-20.9, adult: Secondary | ICD-10-CM | POA: Diagnosis not present

## 2022-11-22 DIAGNOSIS — J449 Chronic obstructive pulmonary disease, unspecified: Secondary | ICD-10-CM | POA: Diagnosis not present

## 2022-12-11 DIAGNOSIS — R569 Unspecified convulsions: Secondary | ICD-10-CM | POA: Diagnosis not present

## 2022-12-19 DIAGNOSIS — H25812 Combined forms of age-related cataract, left eye: Secondary | ICD-10-CM | POA: Diagnosis not present

## 2022-12-19 DIAGNOSIS — H52223 Regular astigmatism, bilateral: Secondary | ICD-10-CM | POA: Diagnosis not present

## 2022-12-19 DIAGNOSIS — H259 Unspecified age-related cataract: Secondary | ICD-10-CM | POA: Diagnosis not present

## 2022-12-19 DIAGNOSIS — J45909 Unspecified asthma, uncomplicated: Secondary | ICD-10-CM | POA: Diagnosis not present

## 2022-12-19 DIAGNOSIS — J449 Chronic obstructive pulmonary disease, unspecified: Secondary | ICD-10-CM | POA: Diagnosis not present

## 2023-01-09 DIAGNOSIS — H119 Unspecified disorder of conjunctiva: Secondary | ICD-10-CM | POA: Diagnosis not present

## 2023-02-21 DIAGNOSIS — J449 Chronic obstructive pulmonary disease, unspecified: Secondary | ICD-10-CM | POA: Diagnosis not present

## 2023-02-21 DIAGNOSIS — Z79899 Other long term (current) drug therapy: Secondary | ICD-10-CM | POA: Diagnosis not present

## 2023-02-21 DIAGNOSIS — M109 Gout, unspecified: Secondary | ICD-10-CM | POA: Diagnosis not present

## 2023-02-21 DIAGNOSIS — Z6820 Body mass index (BMI) 20.0-20.9, adult: Secondary | ICD-10-CM | POA: Diagnosis not present

## 2023-02-21 DIAGNOSIS — E785 Hyperlipidemia, unspecified: Secondary | ICD-10-CM | POA: Diagnosis not present

## 2023-02-28 DIAGNOSIS — Z6820 Body mass index (BMI) 20.0-20.9, adult: Secondary | ICD-10-CM | POA: Diagnosis not present

## 2023-02-28 DIAGNOSIS — M109 Gout, unspecified: Secondary | ICD-10-CM | POA: Diagnosis not present

## 2023-04-10 DIAGNOSIS — D4989 Neoplasm of unspecified behavior of other specified sites: Secondary | ICD-10-CM | POA: Diagnosis not present

## 2023-04-10 DIAGNOSIS — L738 Other specified follicular disorders: Secondary | ICD-10-CM | POA: Diagnosis not present

## 2023-04-10 DIAGNOSIS — H02036 Senile entropion of left eye, unspecified eyelid: Secondary | ICD-10-CM | POA: Diagnosis not present

## 2023-04-10 DIAGNOSIS — H02033 Senile entropion of right eye, unspecified eyelid: Secondary | ICD-10-CM | POA: Diagnosis not present

## 2023-05-08 DIAGNOSIS — H02132 Senile ectropion of right lower eyelid: Secondary | ICD-10-CM | POA: Diagnosis not present

## 2023-05-08 DIAGNOSIS — H02135 Senile ectropion of left lower eyelid: Secondary | ICD-10-CM | POA: Diagnosis not present

## 2023-05-18 DIAGNOSIS — H25812 Combined forms of age-related cataract, left eye: Secondary | ICD-10-CM | POA: Diagnosis not present

## 2023-05-18 DIAGNOSIS — Z01818 Encounter for other preprocedural examination: Secondary | ICD-10-CM | POA: Diagnosis not present

## 2023-05-18 DIAGNOSIS — H25811 Combined forms of age-related cataract, right eye: Secondary | ICD-10-CM | POA: Diagnosis not present

## 2023-05-23 DIAGNOSIS — J449 Chronic obstructive pulmonary disease, unspecified: Secondary | ICD-10-CM | POA: Diagnosis not present

## 2023-05-23 DIAGNOSIS — I700 Atherosclerosis of aorta: Secondary | ICD-10-CM | POA: Diagnosis not present

## 2023-05-23 DIAGNOSIS — Z681 Body mass index (BMI) 19 or less, adult: Secondary | ICD-10-CM | POA: Diagnosis not present

## 2023-05-23 DIAGNOSIS — Z79899 Other long term (current) drug therapy: Secondary | ICD-10-CM | POA: Diagnosis not present

## 2023-05-23 DIAGNOSIS — M109 Gout, unspecified: Secondary | ICD-10-CM | POA: Diagnosis not present

## 2023-05-23 DIAGNOSIS — E785 Hyperlipidemia, unspecified: Secondary | ICD-10-CM | POA: Diagnosis not present

## 2023-06-05 DIAGNOSIS — F17210 Nicotine dependence, cigarettes, uncomplicated: Secondary | ICD-10-CM | POA: Diagnosis not present

## 2023-06-05 DIAGNOSIS — H25811 Combined forms of age-related cataract, right eye: Secondary | ICD-10-CM | POA: Diagnosis not present

## 2023-06-05 DIAGNOSIS — H259 Unspecified age-related cataract: Secondary | ICD-10-CM | POA: Diagnosis not present

## 2023-06-12 DIAGNOSIS — R569 Unspecified convulsions: Secondary | ICD-10-CM | POA: Diagnosis not present

## 2023-08-02 DIAGNOSIS — Z0000 Encounter for general adult medical examination without abnormal findings: Secondary | ICD-10-CM | POA: Diagnosis not present

## 2023-08-02 DIAGNOSIS — Z9181 History of falling: Secondary | ICD-10-CM | POA: Diagnosis not present

## 2023-08-23 DIAGNOSIS — E785 Hyperlipidemia, unspecified: Secondary | ICD-10-CM | POA: Diagnosis not present

## 2023-08-23 DIAGNOSIS — Z23 Encounter for immunization: Secondary | ICD-10-CM | POA: Diagnosis not present

## 2023-08-23 DIAGNOSIS — J449 Chronic obstructive pulmonary disease, unspecified: Secondary | ICD-10-CM | POA: Diagnosis not present

## 2023-08-23 DIAGNOSIS — Z681 Body mass index (BMI) 19 or less, adult: Secondary | ICD-10-CM | POA: Diagnosis not present

## 2023-11-05 DIAGNOSIS — I499 Cardiac arrhythmia, unspecified: Secondary | ICD-10-CM | POA: Diagnosis not present

## 2023-11-05 DIAGNOSIS — R569 Unspecified convulsions: Secondary | ICD-10-CM | POA: Diagnosis not present

## 2023-11-05 DIAGNOSIS — R6889 Other general symptoms and signs: Secondary | ICD-10-CM | POA: Diagnosis not present

## 2023-11-05 DIAGNOSIS — M25561 Pain in right knee: Secondary | ICD-10-CM | POA: Diagnosis not present

## 2023-11-05 DIAGNOSIS — R404 Transient alteration of awareness: Secondary | ICD-10-CM | POA: Diagnosis not present

## 2023-11-07 DIAGNOSIS — J439 Emphysema, unspecified: Secondary | ICD-10-CM | POA: Diagnosis not present

## 2023-11-07 DIAGNOSIS — I6523 Occlusion and stenosis of bilateral carotid arteries: Secondary | ICD-10-CM | POA: Diagnosis not present

## 2023-11-07 DIAGNOSIS — R6889 Other general symptoms and signs: Secondary | ICD-10-CM | POA: Diagnosis not present

## 2023-11-07 DIAGNOSIS — Z681 Body mass index (BMI) 19 or less, adult: Secondary | ICD-10-CM | POA: Diagnosis not present

## 2023-11-07 DIAGNOSIS — E870 Hyperosmolality and hypernatremia: Secondary | ICD-10-CM | POA: Diagnosis not present

## 2023-11-07 DIAGNOSIS — L89156 Pressure-induced deep tissue damage of sacral region: Secondary | ICD-10-CM | POA: Diagnosis not present

## 2023-11-07 DIAGNOSIS — I6782 Cerebral ischemia: Secondary | ICD-10-CM | POA: Diagnosis not present

## 2023-11-07 DIAGNOSIS — R278 Other lack of coordination: Secondary | ICD-10-CM | POA: Diagnosis not present

## 2023-11-07 DIAGNOSIS — R262 Difficulty in walking, not elsewhere classified: Secondary | ICD-10-CM | POA: Diagnosis not present

## 2023-11-07 DIAGNOSIS — K222 Esophageal obstruction: Secondary | ICD-10-CM | POA: Diagnosis not present

## 2023-11-07 DIAGNOSIS — L89151 Pressure ulcer of sacral region, stage 1: Secondary | ICD-10-CM | POA: Diagnosis not present

## 2023-11-07 DIAGNOSIS — Z931 Gastrostomy status: Secondary | ICD-10-CM | POA: Diagnosis not present

## 2023-11-07 DIAGNOSIS — R1312 Dysphagia, oropharyngeal phase: Secondary | ICD-10-CM | POA: Diagnosis not present

## 2023-11-07 DIAGNOSIS — I499 Cardiac arrhythmia, unspecified: Secondary | ICD-10-CM | POA: Diagnosis not present

## 2023-11-07 DIAGNOSIS — R488 Other symbolic dysfunctions: Secondary | ICD-10-CM | POA: Diagnosis not present

## 2023-11-07 DIAGNOSIS — R9089 Other abnormal findings on diagnostic imaging of central nervous system: Secondary | ICD-10-CM | POA: Diagnosis not present

## 2023-11-07 DIAGNOSIS — R0602 Shortness of breath: Secondary | ICD-10-CM | POA: Diagnosis not present

## 2023-11-07 DIAGNOSIS — E43 Unspecified severe protein-calorie malnutrition: Secondary | ICD-10-CM | POA: Diagnosis not present

## 2023-11-07 DIAGNOSIS — Z431 Encounter for attention to gastrostomy: Secondary | ICD-10-CM | POA: Diagnosis not present

## 2023-11-07 DIAGNOSIS — L89892 Pressure ulcer of other site, stage 2: Secondary | ICD-10-CM | POA: Diagnosis not present

## 2023-11-07 DIAGNOSIS — R509 Fever, unspecified: Secondary | ICD-10-CM | POA: Diagnosis not present

## 2023-11-07 DIAGNOSIS — I672 Cerebral atherosclerosis: Secondary | ICD-10-CM | POA: Diagnosis not present

## 2023-11-07 DIAGNOSIS — G9389 Other specified disorders of brain: Secondary | ICD-10-CM | POA: Diagnosis not present

## 2023-11-07 DIAGNOSIS — R9401 Abnormal electroencephalogram [EEG]: Secondary | ICD-10-CM | POA: Diagnosis not present

## 2023-11-07 DIAGNOSIS — J449 Chronic obstructive pulmonary disease, unspecified: Secondary | ICD-10-CM | POA: Diagnosis not present

## 2023-11-07 DIAGNOSIS — R569 Unspecified convulsions: Secondary | ICD-10-CM | POA: Diagnosis not present

## 2023-11-07 DIAGNOSIS — K449 Diaphragmatic hernia without obstruction or gangrene: Secondary | ICD-10-CM | POA: Diagnosis not present

## 2023-11-07 DIAGNOSIS — R404 Transient alteration of awareness: Secondary | ICD-10-CM | POA: Diagnosis not present

## 2023-11-07 DIAGNOSIS — R000 Tachycardia, unspecified: Secondary | ICD-10-CM | POA: Diagnosis not present

## 2023-11-07 DIAGNOSIS — Z743 Need for continuous supervision: Secondary | ICD-10-CM | POA: Diagnosis not present

## 2023-11-07 DIAGNOSIS — I493 Ventricular premature depolarization: Secondary | ICD-10-CM | POA: Diagnosis not present

## 2023-11-07 DIAGNOSIS — R29818 Other symptoms and signs involving the nervous system: Secondary | ICD-10-CM | POA: Diagnosis not present

## 2023-11-07 DIAGNOSIS — A419 Sepsis, unspecified organism: Secondary | ICD-10-CM | POA: Diagnosis not present

## 2023-11-07 DIAGNOSIS — I10 Essential (primary) hypertension: Secondary | ICD-10-CM | POA: Diagnosis not present

## 2023-11-07 DIAGNOSIS — G9341 Metabolic encephalopathy: Secondary | ICD-10-CM | POA: Diagnosis not present

## 2023-11-07 DIAGNOSIS — J440 Chronic obstructive pulmonary disease with acute lower respiratory infection: Secondary | ICD-10-CM | POA: Diagnosis not present

## 2023-11-07 DIAGNOSIS — R42 Dizziness and giddiness: Secondary | ICD-10-CM | POA: Diagnosis not present

## 2023-11-07 DIAGNOSIS — R4701 Aphasia: Secondary | ICD-10-CM | POA: Diagnosis not present

## 2023-11-07 DIAGNOSIS — Z781 Physical restraint status: Secondary | ICD-10-CM | POA: Diagnosis not present

## 2023-11-07 DIAGNOSIS — R1310 Dysphagia, unspecified: Secondary | ICD-10-CM | POA: Diagnosis not present

## 2023-11-07 DIAGNOSIS — I700 Atherosclerosis of aorta: Secondary | ICD-10-CM | POA: Diagnosis not present

## 2023-11-07 DIAGNOSIS — Z4682 Encounter for fitting and adjustment of non-vascular catheter: Secondary | ICD-10-CM | POA: Diagnosis not present

## 2023-11-07 DIAGNOSIS — J9601 Acute respiratory failure with hypoxia: Secondary | ICD-10-CM | POA: Diagnosis not present

## 2023-11-07 DIAGNOSIS — R918 Other nonspecific abnormal finding of lung field: Secondary | ICD-10-CM | POA: Diagnosis not present

## 2023-11-07 DIAGNOSIS — M6281 Muscle weakness (generalized): Secondary | ICD-10-CM | POA: Diagnosis not present

## 2023-11-08 DIAGNOSIS — R569 Unspecified convulsions: Secondary | ICD-10-CM | POA: Diagnosis not present

## 2023-11-08 DIAGNOSIS — R42 Dizziness and giddiness: Secondary | ICD-10-CM | POA: Diagnosis not present

## 2023-11-08 DIAGNOSIS — I700 Atherosclerosis of aorta: Secondary | ICD-10-CM | POA: Diagnosis not present

## 2023-11-09 DIAGNOSIS — I672 Cerebral atherosclerosis: Secondary | ICD-10-CM | POA: Diagnosis not present

## 2023-11-09 DIAGNOSIS — I6523 Occlusion and stenosis of bilateral carotid arteries: Secondary | ICD-10-CM | POA: Diagnosis not present

## 2023-11-09 DIAGNOSIS — R9089 Other abnormal findings on diagnostic imaging of central nervous system: Secondary | ICD-10-CM | POA: Diagnosis not present

## 2023-11-09 DIAGNOSIS — R569 Unspecified convulsions: Secondary | ICD-10-CM | POA: Diagnosis not present

## 2023-11-10 DIAGNOSIS — R29818 Other symptoms and signs involving the nervous system: Secondary | ICD-10-CM | POA: Diagnosis not present

## 2023-11-10 DIAGNOSIS — I6782 Cerebral ischemia: Secondary | ICD-10-CM | POA: Diagnosis not present

## 2023-11-10 DIAGNOSIS — R0602 Shortness of breath: Secondary | ICD-10-CM | POA: Diagnosis not present

## 2023-11-10 DIAGNOSIS — R569 Unspecified convulsions: Secondary | ICD-10-CM | POA: Diagnosis not present

## 2023-11-11 DIAGNOSIS — R569 Unspecified convulsions: Secondary | ICD-10-CM | POA: Diagnosis not present

## 2023-11-12 DIAGNOSIS — R569 Unspecified convulsions: Secondary | ICD-10-CM | POA: Diagnosis not present

## 2023-11-12 DIAGNOSIS — R509 Fever, unspecified: Secondary | ICD-10-CM | POA: Diagnosis not present

## 2023-11-13 DIAGNOSIS — J9601 Acute respiratory failure with hypoxia: Secondary | ICD-10-CM | POA: Diagnosis not present

## 2023-11-13 DIAGNOSIS — J439 Emphysema, unspecified: Secondary | ICD-10-CM | POA: Diagnosis not present

## 2023-11-13 DIAGNOSIS — R918 Other nonspecific abnormal finding of lung field: Secondary | ICD-10-CM | POA: Diagnosis not present

## 2023-11-13 DIAGNOSIS — R569 Unspecified convulsions: Secondary | ICD-10-CM | POA: Diagnosis not present

## 2023-11-13 DIAGNOSIS — I700 Atherosclerosis of aorta: Secondary | ICD-10-CM | POA: Diagnosis not present

## 2023-11-13 DIAGNOSIS — Z4682 Encounter for fitting and adjustment of non-vascular catheter: Secondary | ICD-10-CM | POA: Diagnosis not present

## 2023-11-14 DIAGNOSIS — R569 Unspecified convulsions: Secondary | ICD-10-CM | POA: Diagnosis not present

## 2023-11-14 DIAGNOSIS — Z4682 Encounter for fitting and adjustment of non-vascular catheter: Secondary | ICD-10-CM | POA: Diagnosis not present

## 2023-11-14 DIAGNOSIS — G9389 Other specified disorders of brain: Secondary | ICD-10-CM | POA: Diagnosis not present

## 2023-11-14 DIAGNOSIS — R9401 Abnormal electroencephalogram [EEG]: Secondary | ICD-10-CM | POA: Diagnosis not present

## 2023-11-15 DIAGNOSIS — G9389 Other specified disorders of brain: Secondary | ICD-10-CM | POA: Diagnosis not present

## 2023-11-15 DIAGNOSIS — R9401 Abnormal electroencephalogram [EEG]: Secondary | ICD-10-CM | POA: Diagnosis not present

## 2023-11-15 DIAGNOSIS — R569 Unspecified convulsions: Secondary | ICD-10-CM | POA: Diagnosis not present

## 2023-11-15 DIAGNOSIS — I6523 Occlusion and stenosis of bilateral carotid arteries: Secondary | ICD-10-CM | POA: Diagnosis not present

## 2023-11-16 DIAGNOSIS — R9401 Abnormal electroencephalogram [EEG]: Secondary | ICD-10-CM | POA: Diagnosis not present

## 2023-11-16 DIAGNOSIS — G9389 Other specified disorders of brain: Secondary | ICD-10-CM | POA: Diagnosis not present

## 2023-11-16 DIAGNOSIS — R569 Unspecified convulsions: Secondary | ICD-10-CM | POA: Diagnosis not present

## 2023-11-17 DIAGNOSIS — R569 Unspecified convulsions: Secondary | ICD-10-CM | POA: Diagnosis not present

## 2023-11-18 DIAGNOSIS — R569 Unspecified convulsions: Secondary | ICD-10-CM | POA: Diagnosis not present

## 2023-11-19 DIAGNOSIS — R569 Unspecified convulsions: Secondary | ICD-10-CM | POA: Diagnosis not present

## 2023-11-19 DIAGNOSIS — I493 Ventricular premature depolarization: Secondary | ICD-10-CM | POA: Diagnosis not present

## 2023-11-19 DIAGNOSIS — R000 Tachycardia, unspecified: Secondary | ICD-10-CM | POA: Diagnosis not present

## 2023-11-20 DIAGNOSIS — R569 Unspecified convulsions: Secondary | ICD-10-CM | POA: Diagnosis not present

## 2023-11-21 DIAGNOSIS — R569 Unspecified convulsions: Secondary | ICD-10-CM | POA: Diagnosis not present

## 2023-11-22 DIAGNOSIS — R569 Unspecified convulsions: Secondary | ICD-10-CM | POA: Diagnosis not present

## 2023-11-22 DIAGNOSIS — R0602 Shortness of breath: Secondary | ICD-10-CM | POA: Diagnosis not present

## 2023-11-23 DIAGNOSIS — R569 Unspecified convulsions: Secondary | ICD-10-CM | POA: Diagnosis not present

## 2023-11-23 DIAGNOSIS — R1310 Dysphagia, unspecified: Secondary | ICD-10-CM | POA: Diagnosis not present

## 2023-11-24 DIAGNOSIS — R569 Unspecified convulsions: Secondary | ICD-10-CM | POA: Diagnosis not present

## 2023-11-25 DIAGNOSIS — R569 Unspecified convulsions: Secondary | ICD-10-CM | POA: Diagnosis not present

## 2023-11-26 DIAGNOSIS — R569 Unspecified convulsions: Secondary | ICD-10-CM | POA: Diagnosis not present

## 2023-11-27 DIAGNOSIS — R569 Unspecified convulsions: Secondary | ICD-10-CM | POA: Diagnosis not present

## 2023-11-28 DIAGNOSIS — R1312 Dysphagia, oropharyngeal phase: Secondary | ICD-10-CM | POA: Diagnosis not present

## 2023-11-28 DIAGNOSIS — R569 Unspecified convulsions: Secondary | ICD-10-CM | POA: Diagnosis not present

## 2023-11-29 DIAGNOSIS — K222 Esophageal obstruction: Secondary | ICD-10-CM | POA: Diagnosis not present

## 2023-11-29 DIAGNOSIS — R569 Unspecified convulsions: Secondary | ICD-10-CM | POA: Diagnosis not present

## 2023-11-29 DIAGNOSIS — R1312 Dysphagia, oropharyngeal phase: Secondary | ICD-10-CM | POA: Diagnosis not present

## 2023-11-29 DIAGNOSIS — K449 Diaphragmatic hernia without obstruction or gangrene: Secondary | ICD-10-CM | POA: Diagnosis not present

## 2023-11-30 DIAGNOSIS — R569 Unspecified convulsions: Secondary | ICD-10-CM | POA: Diagnosis not present

## 2023-12-01 DIAGNOSIS — R569 Unspecified convulsions: Secondary | ICD-10-CM | POA: Diagnosis not present

## 2023-12-02 DIAGNOSIS — R569 Unspecified convulsions: Secondary | ICD-10-CM | POA: Diagnosis not present

## 2023-12-03 DIAGNOSIS — L89154 Pressure ulcer of sacral region, stage 4: Secondary | ICD-10-CM | POA: Diagnosis not present

## 2023-12-03 DIAGNOSIS — Z931 Gastrostomy status: Secondary | ICD-10-CM | POA: Diagnosis not present

## 2023-12-03 DIAGNOSIS — M6281 Muscle weakness (generalized): Secondary | ICD-10-CM | POA: Diagnosis not present

## 2023-12-03 DIAGNOSIS — Z431 Encounter for attention to gastrostomy: Secondary | ICD-10-CM | POA: Diagnosis not present

## 2023-12-03 DIAGNOSIS — R059 Cough, unspecified: Secondary | ICD-10-CM | POA: Diagnosis not present

## 2023-12-03 DIAGNOSIS — J449 Chronic obstructive pulmonary disease, unspecified: Secondary | ICD-10-CM | POA: Diagnosis not present

## 2023-12-03 DIAGNOSIS — Z87891 Personal history of nicotine dependence: Secondary | ICD-10-CM | POA: Diagnosis not present

## 2023-12-03 DIAGNOSIS — Z79899 Other long term (current) drug therapy: Secondary | ICD-10-CM | POA: Diagnosis not present

## 2023-12-03 DIAGNOSIS — R9431 Abnormal electrocardiogram [ECG] [EKG]: Secondary | ICD-10-CM | POA: Diagnosis not present

## 2023-12-03 DIAGNOSIS — K219 Gastro-esophageal reflux disease without esophagitis: Secondary | ICD-10-CM | POA: Diagnosis not present

## 2023-12-03 DIAGNOSIS — G4700 Insomnia, unspecified: Secondary | ICD-10-CM | POA: Diagnosis not present

## 2023-12-03 DIAGNOSIS — E785 Hyperlipidemia, unspecified: Secondary | ICD-10-CM | POA: Diagnosis not present

## 2023-12-03 DIAGNOSIS — R000 Tachycardia, unspecified: Secondary | ICD-10-CM | POA: Diagnosis not present

## 2023-12-03 DIAGNOSIS — Z743 Need for continuous supervision: Secondary | ICD-10-CM | POA: Diagnosis not present

## 2023-12-03 DIAGNOSIS — R251 Tremor, unspecified: Secondary | ICD-10-CM | POA: Diagnosis not present

## 2023-12-03 DIAGNOSIS — G9340 Encephalopathy, unspecified: Secondary | ICD-10-CM | POA: Diagnosis not present

## 2023-12-03 DIAGNOSIS — R488 Other symbolic dysfunctions: Secondary | ICD-10-CM | POA: Diagnosis not present

## 2023-12-03 DIAGNOSIS — Z91148 Patient's other noncompliance with medication regimen for other reason: Secondary | ICD-10-CM | POA: Diagnosis not present

## 2023-12-03 DIAGNOSIS — R278 Other lack of coordination: Secondary | ICD-10-CM | POA: Diagnosis not present

## 2023-12-03 DIAGNOSIS — R1312 Dysphagia, oropharyngeal phase: Secondary | ICD-10-CM | POA: Diagnosis not present

## 2023-12-03 DIAGNOSIS — R1311 Dysphagia, oral phase: Secondary | ICD-10-CM | POA: Diagnosis not present

## 2023-12-03 DIAGNOSIS — R5381 Other malaise: Secondary | ICD-10-CM | POA: Diagnosis not present

## 2023-12-03 DIAGNOSIS — E46 Unspecified protein-calorie malnutrition: Secondary | ICD-10-CM | POA: Diagnosis not present

## 2023-12-03 DIAGNOSIS — R29898 Other symptoms and signs involving the musculoskeletal system: Secondary | ICD-10-CM | POA: Diagnosis not present

## 2023-12-03 DIAGNOSIS — I6523 Occlusion and stenosis of bilateral carotid arteries: Secondary | ICD-10-CM | POA: Diagnosis not present

## 2023-12-03 DIAGNOSIS — I10 Essential (primary) hypertension: Secondary | ICD-10-CM | POA: Diagnosis not present

## 2023-12-03 DIAGNOSIS — R262 Difficulty in walking, not elsewhere classified: Secondary | ICD-10-CM | POA: Diagnosis not present

## 2023-12-03 DIAGNOSIS — R569 Unspecified convulsions: Secondary | ICD-10-CM | POA: Diagnosis not present

## 2023-12-03 DIAGNOSIS — R112 Nausea with vomiting, unspecified: Secondary | ICD-10-CM | POA: Diagnosis not present

## 2023-12-04 DIAGNOSIS — R1311 Dysphagia, oral phase: Secondary | ICD-10-CM | POA: Diagnosis not present

## 2023-12-04 DIAGNOSIS — Z931 Gastrostomy status: Secondary | ICD-10-CM | POA: Diagnosis not present

## 2023-12-04 DIAGNOSIS — I10 Essential (primary) hypertension: Secondary | ICD-10-CM | POA: Diagnosis not present

## 2023-12-04 DIAGNOSIS — G4700 Insomnia, unspecified: Secondary | ICD-10-CM | POA: Diagnosis not present

## 2023-12-04 DIAGNOSIS — E785 Hyperlipidemia, unspecified: Secondary | ICD-10-CM | POA: Diagnosis not present

## 2023-12-04 DIAGNOSIS — J449 Chronic obstructive pulmonary disease, unspecified: Secondary | ICD-10-CM | POA: Diagnosis not present

## 2023-12-05 DIAGNOSIS — R251 Tremor, unspecified: Secondary | ICD-10-CM | POA: Diagnosis not present

## 2023-12-05 DIAGNOSIS — Z931 Gastrostomy status: Secondary | ICD-10-CM | POA: Diagnosis not present

## 2023-12-05 DIAGNOSIS — R1311 Dysphagia, oral phase: Secondary | ICD-10-CM | POA: Diagnosis not present

## 2023-12-05 DIAGNOSIS — Z79899 Other long term (current) drug therapy: Secondary | ICD-10-CM | POA: Diagnosis not present

## 2023-12-05 DIAGNOSIS — Z91148 Patient's other noncompliance with medication regimen for other reason: Secondary | ICD-10-CM | POA: Diagnosis not present

## 2023-12-05 DIAGNOSIS — I10 Essential (primary) hypertension: Secondary | ICD-10-CM | POA: Diagnosis not present

## 2023-12-05 DIAGNOSIS — J449 Chronic obstructive pulmonary disease, unspecified: Secondary | ICD-10-CM | POA: Diagnosis not present

## 2023-12-05 DIAGNOSIS — R569 Unspecified convulsions: Secondary | ICD-10-CM | POA: Diagnosis not present

## 2023-12-08 DIAGNOSIS — Z931 Gastrostomy status: Secondary | ICD-10-CM | POA: Diagnosis not present

## 2023-12-08 DIAGNOSIS — R000 Tachycardia, unspecified: Secondary | ICD-10-CM | POA: Diagnosis not present

## 2023-12-08 DIAGNOSIS — G9340 Encephalopathy, unspecified: Secondary | ICD-10-CM | POA: Diagnosis not present

## 2023-12-08 DIAGNOSIS — I10 Essential (primary) hypertension: Secondary | ICD-10-CM | POA: Diagnosis not present

## 2023-12-08 DIAGNOSIS — R5381 Other malaise: Secondary | ICD-10-CM | POA: Diagnosis not present

## 2023-12-08 DIAGNOSIS — Z79899 Other long term (current) drug therapy: Secondary | ICD-10-CM | POA: Diagnosis not present

## 2023-12-08 DIAGNOSIS — R9431 Abnormal electrocardiogram [ECG] [EKG]: Secondary | ICD-10-CM | POA: Diagnosis not present

## 2023-12-08 DIAGNOSIS — Z87891 Personal history of nicotine dependence: Secondary | ICD-10-CM | POA: Diagnosis not present

## 2023-12-08 DIAGNOSIS — Z431 Encounter for attention to gastrostomy: Secondary | ICD-10-CM | POA: Diagnosis not present

## 2023-12-08 DIAGNOSIS — I6523 Occlusion and stenosis of bilateral carotid arteries: Secondary | ICD-10-CM | POA: Diagnosis not present

## 2023-12-08 DIAGNOSIS — K219 Gastro-esophageal reflux disease without esophagitis: Secondary | ICD-10-CM | POA: Diagnosis not present

## 2023-12-08 DIAGNOSIS — G4700 Insomnia, unspecified: Secondary | ICD-10-CM | POA: Diagnosis not present

## 2023-12-08 DIAGNOSIS — R059 Cough, unspecified: Secondary | ICD-10-CM | POA: Diagnosis not present

## 2023-12-08 DIAGNOSIS — R262 Difficulty in walking, not elsewhere classified: Secondary | ICD-10-CM | POA: Diagnosis not present

## 2023-12-08 DIAGNOSIS — R1312 Dysphagia, oropharyngeal phase: Secondary | ICD-10-CM | POA: Diagnosis not present

## 2023-12-08 DIAGNOSIS — R112 Nausea with vomiting, unspecified: Secondary | ICD-10-CM | POA: Diagnosis not present

## 2023-12-08 DIAGNOSIS — J449 Chronic obstructive pulmonary disease, unspecified: Secondary | ICD-10-CM | POA: Diagnosis not present

## 2023-12-08 DIAGNOSIS — R488 Other symbolic dysfunctions: Secondary | ICD-10-CM | POA: Diagnosis not present

## 2023-12-08 DIAGNOSIS — E46 Unspecified protein-calorie malnutrition: Secondary | ICD-10-CM | POA: Diagnosis not present

## 2023-12-08 DIAGNOSIS — L89154 Pressure ulcer of sacral region, stage 4: Secondary | ICD-10-CM | POA: Diagnosis not present

## 2023-12-08 DIAGNOSIS — R29898 Other symptoms and signs involving the musculoskeletal system: Secondary | ICD-10-CM | POA: Diagnosis not present

## 2023-12-08 DIAGNOSIS — E785 Hyperlipidemia, unspecified: Secondary | ICD-10-CM | POA: Diagnosis not present

## 2023-12-08 DIAGNOSIS — M6281 Muscle weakness (generalized): Secondary | ICD-10-CM | POA: Diagnosis not present

## 2023-12-08 DIAGNOSIS — R1311 Dysphagia, oral phase: Secondary | ICD-10-CM | POA: Diagnosis not present

## 2023-12-08 DIAGNOSIS — R569 Unspecified convulsions: Secondary | ICD-10-CM | POA: Diagnosis not present

## 2023-12-08 DIAGNOSIS — R278 Other lack of coordination: Secondary | ICD-10-CM | POA: Diagnosis not present

## 2023-12-10 DIAGNOSIS — L89154 Pressure ulcer of sacral region, stage 4: Secondary | ICD-10-CM | POA: Diagnosis not present

## 2023-12-10 DIAGNOSIS — R569 Unspecified convulsions: Secondary | ICD-10-CM | POA: Diagnosis not present

## 2023-12-10 DIAGNOSIS — G4700 Insomnia, unspecified: Secondary | ICD-10-CM | POA: Diagnosis not present

## 2023-12-10 DIAGNOSIS — Z79899 Other long term (current) drug therapy: Secondary | ICD-10-CM | POA: Diagnosis not present

## 2023-12-10 DIAGNOSIS — I10 Essential (primary) hypertension: Secondary | ICD-10-CM | POA: Diagnosis not present

## 2023-12-10 DIAGNOSIS — K219 Gastro-esophageal reflux disease without esophagitis: Secondary | ICD-10-CM | POA: Diagnosis not present

## 2023-12-11 DIAGNOSIS — R112 Nausea with vomiting, unspecified: Secondary | ICD-10-CM | POA: Diagnosis not present

## 2023-12-12 DIAGNOSIS — G4700 Insomnia, unspecified: Secondary | ICD-10-CM | POA: Diagnosis not present

## 2023-12-12 DIAGNOSIS — Z931 Gastrostomy status: Secondary | ICD-10-CM | POA: Diagnosis not present

## 2023-12-12 DIAGNOSIS — R1311 Dysphagia, oral phase: Secondary | ICD-10-CM | POA: Diagnosis not present

## 2023-12-12 DIAGNOSIS — I10 Essential (primary) hypertension: Secondary | ICD-10-CM | POA: Diagnosis not present

## 2023-12-12 DIAGNOSIS — K219 Gastro-esophageal reflux disease without esophagitis: Secondary | ICD-10-CM | POA: Diagnosis not present

## 2023-12-17 DIAGNOSIS — I10 Essential (primary) hypertension: Secondary | ICD-10-CM | POA: Diagnosis not present

## 2023-12-17 DIAGNOSIS — Z931 Gastrostomy status: Secondary | ICD-10-CM | POA: Diagnosis not present

## 2023-12-17 DIAGNOSIS — M6281 Muscle weakness (generalized): Secondary | ICD-10-CM | POA: Diagnosis not present

## 2023-12-17 DIAGNOSIS — J449 Chronic obstructive pulmonary disease, unspecified: Secondary | ICD-10-CM | POA: Diagnosis not present

## 2023-12-17 DIAGNOSIS — R1312 Dysphagia, oropharyngeal phase: Secondary | ICD-10-CM | POA: Diagnosis not present

## 2023-12-17 DIAGNOSIS — K219 Gastro-esophageal reflux disease without esophagitis: Secondary | ICD-10-CM | POA: Diagnosis not present

## 2023-12-17 DIAGNOSIS — G4700 Insomnia, unspecified: Secondary | ICD-10-CM | POA: Diagnosis not present

## 2023-12-17 DIAGNOSIS — Z431 Encounter for attention to gastrostomy: Secondary | ICD-10-CM | POA: Diagnosis not present

## 2023-12-17 DIAGNOSIS — R278 Other lack of coordination: Secondary | ICD-10-CM | POA: Diagnosis not present

## 2023-12-17 DIAGNOSIS — E785 Hyperlipidemia, unspecified: Secondary | ICD-10-CM | POA: Diagnosis not present

## 2023-12-19 DIAGNOSIS — G4700 Insomnia, unspecified: Secondary | ICD-10-CM | POA: Diagnosis not present

## 2023-12-19 DIAGNOSIS — Z931 Gastrostomy status: Secondary | ICD-10-CM | POA: Diagnosis not present

## 2023-12-19 DIAGNOSIS — I10 Essential (primary) hypertension: Secondary | ICD-10-CM | POA: Diagnosis not present

## 2023-12-19 DIAGNOSIS — R1311 Dysphagia, oral phase: Secondary | ICD-10-CM | POA: Diagnosis not present

## 2023-12-19 DIAGNOSIS — Z79899 Other long term (current) drug therapy: Secondary | ICD-10-CM | POA: Diagnosis not present

## 2023-12-19 DIAGNOSIS — R569 Unspecified convulsions: Secondary | ICD-10-CM | POA: Diagnosis not present

## 2023-12-19 DIAGNOSIS — K219 Gastro-esophageal reflux disease without esophagitis: Secondary | ICD-10-CM | POA: Diagnosis not present

## 2023-12-24 DIAGNOSIS — L89154 Pressure ulcer of sacral region, stage 4: Secondary | ICD-10-CM | POA: Diagnosis not present

## 2023-12-25 DIAGNOSIS — J449 Chronic obstructive pulmonary disease, unspecified: Secondary | ICD-10-CM | POA: Diagnosis not present

## 2023-12-25 DIAGNOSIS — R1311 Dysphagia, oral phase: Secondary | ICD-10-CM | POA: Diagnosis not present

## 2023-12-27 DIAGNOSIS — R059 Cough, unspecified: Secondary | ICD-10-CM | POA: Diagnosis not present

## 2023-12-27 DIAGNOSIS — K219 Gastro-esophageal reflux disease without esophagitis: Secondary | ICD-10-CM | POA: Diagnosis not present

## 2023-12-27 DIAGNOSIS — G4700 Insomnia, unspecified: Secondary | ICD-10-CM | POA: Diagnosis not present

## 2023-12-27 DIAGNOSIS — I10 Essential (primary) hypertension: Secondary | ICD-10-CM | POA: Diagnosis not present

## 2023-12-27 DIAGNOSIS — R1311 Dysphagia, oral phase: Secondary | ICD-10-CM | POA: Diagnosis not present

## 2023-12-27 DIAGNOSIS — R1312 Dysphagia, oropharyngeal phase: Secondary | ICD-10-CM | POA: Diagnosis not present

## 2023-12-27 DIAGNOSIS — Z931 Gastrostomy status: Secondary | ICD-10-CM | POA: Diagnosis not present

## 2023-12-28 DIAGNOSIS — I10 Essential (primary) hypertension: Secondary | ICD-10-CM | POA: Diagnosis not present

## 2023-12-28 DIAGNOSIS — G4700 Insomnia, unspecified: Secondary | ICD-10-CM | POA: Diagnosis not present

## 2023-12-28 DIAGNOSIS — R1311 Dysphagia, oral phase: Secondary | ICD-10-CM | POA: Diagnosis not present

## 2023-12-28 DIAGNOSIS — Z931 Gastrostomy status: Secondary | ICD-10-CM | POA: Diagnosis not present

## 2023-12-28 DIAGNOSIS — K219 Gastro-esophageal reflux disease without esophagitis: Secondary | ICD-10-CM | POA: Diagnosis not present

## 2023-12-31 DIAGNOSIS — E785 Hyperlipidemia, unspecified: Secondary | ICD-10-CM | POA: Diagnosis not present

## 2023-12-31 DIAGNOSIS — Z931 Gastrostomy status: Secondary | ICD-10-CM | POA: Diagnosis not present

## 2023-12-31 DIAGNOSIS — M6281 Muscle weakness (generalized): Secondary | ICD-10-CM | POA: Diagnosis not present

## 2023-12-31 DIAGNOSIS — Z431 Encounter for attention to gastrostomy: Secondary | ICD-10-CM | POA: Diagnosis not present

## 2023-12-31 DIAGNOSIS — R278 Other lack of coordination: Secondary | ICD-10-CM | POA: Diagnosis not present

## 2023-12-31 DIAGNOSIS — L89154 Pressure ulcer of sacral region, stage 4: Secondary | ICD-10-CM | POA: Diagnosis not present

## 2023-12-31 DIAGNOSIS — K219 Gastro-esophageal reflux disease without esophagitis: Secondary | ICD-10-CM | POA: Diagnosis not present

## 2023-12-31 DIAGNOSIS — I10 Essential (primary) hypertension: Secondary | ICD-10-CM | POA: Diagnosis not present

## 2023-12-31 DIAGNOSIS — J449 Chronic obstructive pulmonary disease, unspecified: Secondary | ICD-10-CM | POA: Diagnosis not present

## 2023-12-31 DIAGNOSIS — R1312 Dysphagia, oropharyngeal phase: Secondary | ICD-10-CM | POA: Diagnosis not present

## 2023-12-31 DIAGNOSIS — G4700 Insomnia, unspecified: Secondary | ICD-10-CM | POA: Diagnosis not present

## 2024-01-01 DIAGNOSIS — E46 Unspecified protein-calorie malnutrition: Secondary | ICD-10-CM | POA: Diagnosis not present

## 2024-01-01 DIAGNOSIS — I10 Essential (primary) hypertension: Secondary | ICD-10-CM | POA: Diagnosis not present

## 2024-01-01 DIAGNOSIS — Z931 Gastrostomy status: Secondary | ICD-10-CM | POA: Diagnosis not present

## 2024-01-01 DIAGNOSIS — J449 Chronic obstructive pulmonary disease, unspecified: Secondary | ICD-10-CM | POA: Diagnosis not present

## 2024-01-01 DIAGNOSIS — R1311 Dysphagia, oral phase: Secondary | ICD-10-CM | POA: Diagnosis not present

## 2024-01-02 DIAGNOSIS — K219 Gastro-esophageal reflux disease without esophagitis: Secondary | ICD-10-CM | POA: Diagnosis not present

## 2024-01-02 DIAGNOSIS — E46 Unspecified protein-calorie malnutrition: Secondary | ICD-10-CM | POA: Diagnosis not present

## 2024-01-02 DIAGNOSIS — R1311 Dysphagia, oral phase: Secondary | ICD-10-CM | POA: Diagnosis not present

## 2024-01-02 DIAGNOSIS — G4700 Insomnia, unspecified: Secondary | ICD-10-CM | POA: Diagnosis not present

## 2024-01-03 DIAGNOSIS — R5381 Other malaise: Secondary | ICD-10-CM | POA: Diagnosis not present

## 2024-01-03 DIAGNOSIS — K219 Gastro-esophageal reflux disease without esophagitis: Secondary | ICD-10-CM | POA: Diagnosis not present

## 2024-01-03 DIAGNOSIS — R1311 Dysphagia, oral phase: Secondary | ICD-10-CM | POA: Diagnosis not present

## 2024-01-03 DIAGNOSIS — E46 Unspecified protein-calorie malnutrition: Secondary | ICD-10-CM | POA: Diagnosis not present

## 2024-01-03 DIAGNOSIS — I10 Essential (primary) hypertension: Secondary | ICD-10-CM | POA: Diagnosis not present

## 2024-01-03 DIAGNOSIS — G4700 Insomnia, unspecified: Secondary | ICD-10-CM | POA: Diagnosis not present

## 2024-01-07 DIAGNOSIS — L89154 Pressure ulcer of sacral region, stage 4: Secondary | ICD-10-CM | POA: Diagnosis not present

## 2024-01-08 DIAGNOSIS — R5381 Other malaise: Secondary | ICD-10-CM | POA: Diagnosis not present

## 2024-01-08 DIAGNOSIS — E46 Unspecified protein-calorie malnutrition: Secondary | ICD-10-CM | POA: Diagnosis not present

## 2024-01-08 DIAGNOSIS — R1311 Dysphagia, oral phase: Secondary | ICD-10-CM | POA: Diagnosis not present

## 2024-01-08 DIAGNOSIS — I10 Essential (primary) hypertension: Secondary | ICD-10-CM | POA: Diagnosis not present

## 2024-01-08 DIAGNOSIS — J449 Chronic obstructive pulmonary disease, unspecified: Secondary | ICD-10-CM | POA: Diagnosis not present

## 2024-01-08 DIAGNOSIS — Z931 Gastrostomy status: Secondary | ICD-10-CM | POA: Diagnosis not present

## 2024-01-09 DIAGNOSIS — R569 Unspecified convulsions: Secondary | ICD-10-CM | POA: Diagnosis not present

## 2024-01-09 DIAGNOSIS — I6523 Occlusion and stenosis of bilateral carotid arteries: Secondary | ICD-10-CM | POA: Diagnosis not present

## 2024-01-09 DIAGNOSIS — I10 Essential (primary) hypertension: Secondary | ICD-10-CM | POA: Diagnosis not present

## 2024-01-09 DIAGNOSIS — Z87891 Personal history of nicotine dependence: Secondary | ICD-10-CM | POA: Diagnosis not present

## 2024-01-09 DIAGNOSIS — Z79899 Other long term (current) drug therapy: Secondary | ICD-10-CM | POA: Diagnosis not present

## 2024-01-09 DIAGNOSIS — R1310 Dysphagia, unspecified: Secondary | ICD-10-CM | POA: Diagnosis not present

## 2024-01-09 DIAGNOSIS — R9431 Abnormal electrocardiogram [ECG] [EKG]: Secondary | ICD-10-CM | POA: Diagnosis not present

## 2024-01-09 DIAGNOSIS — R059 Cough, unspecified: Secondary | ICD-10-CM | POA: Diagnosis not present

## 2024-01-09 DIAGNOSIS — R000 Tachycardia, unspecified: Secondary | ICD-10-CM | POA: Diagnosis not present

## 2024-01-09 DIAGNOSIS — R1312 Dysphagia, oropharyngeal phase: Secondary | ICD-10-CM | POA: Diagnosis not present

## 2024-01-09 DIAGNOSIS — R29898 Other symptoms and signs involving the musculoskeletal system: Secondary | ICD-10-CM | POA: Diagnosis not present

## 2024-01-09 DIAGNOSIS — G9340 Encephalopathy, unspecified: Secondary | ICD-10-CM | POA: Diagnosis not present

## 2024-01-11 DIAGNOSIS — R1310 Dysphagia, unspecified: Secondary | ICD-10-CM | POA: Diagnosis not present

## 2024-01-11 DIAGNOSIS — R059 Cough, unspecified: Secondary | ICD-10-CM | POA: Diagnosis not present

## 2024-01-14 DIAGNOSIS — G9340 Encephalopathy, unspecified: Secondary | ICD-10-CM | POA: Diagnosis not present

## 2024-01-14 DIAGNOSIS — J449 Chronic obstructive pulmonary disease, unspecified: Secondary | ICD-10-CM | POA: Diagnosis not present

## 2024-01-14 DIAGNOSIS — I10 Essential (primary) hypertension: Secondary | ICD-10-CM | POA: Diagnosis not present

## 2024-01-14 DIAGNOSIS — R569 Unspecified convulsions: Secondary | ICD-10-CM | POA: Diagnosis not present

## 2024-01-14 DIAGNOSIS — K219 Gastro-esophageal reflux disease without esophagitis: Secondary | ICD-10-CM | POA: Diagnosis not present

## 2024-01-14 DIAGNOSIS — M6250 Muscle wasting and atrophy, not elsewhere classified, unspecified site: Secondary | ICD-10-CM | POA: Diagnosis not present

## 2024-01-14 DIAGNOSIS — E871 Hypo-osmolality and hyponatremia: Secondary | ICD-10-CM | POA: Diagnosis not present

## 2024-01-14 DIAGNOSIS — R1312 Dysphagia, oropharyngeal phase: Secondary | ICD-10-CM | POA: Diagnosis not present

## 2024-01-14 DIAGNOSIS — S31000A Unspecified open wound of lower back and pelvis without penetration into retroperitoneum, initial encounter: Secondary | ICD-10-CM | POA: Diagnosis not present

## 2024-01-14 DIAGNOSIS — Z87891 Personal history of nicotine dependence: Secondary | ICD-10-CM | POA: Diagnosis not present

## 2024-01-14 DIAGNOSIS — R1310 Dysphagia, unspecified: Secondary | ICD-10-CM | POA: Diagnosis not present

## 2024-01-14 DIAGNOSIS — Z743 Need for continuous supervision: Secondary | ICD-10-CM | POA: Diagnosis not present

## 2024-01-14 DIAGNOSIS — R9082 White matter disease, unspecified: Secondary | ICD-10-CM | POA: Diagnosis not present

## 2024-01-14 DIAGNOSIS — R532 Functional quadriplegia: Secondary | ICD-10-CM | POA: Diagnosis not present

## 2024-01-14 DIAGNOSIS — E43 Unspecified severe protein-calorie malnutrition: Secondary | ICD-10-CM | POA: Diagnosis not present

## 2024-01-14 DIAGNOSIS — M6281 Muscle weakness (generalized): Secondary | ICD-10-CM | POA: Diagnosis not present

## 2024-01-14 DIAGNOSIS — R278 Other lack of coordination: Secondary | ICD-10-CM | POA: Diagnosis not present

## 2024-01-14 DIAGNOSIS — E46 Unspecified protein-calorie malnutrition: Secondary | ICD-10-CM | POA: Diagnosis not present

## 2024-01-14 DIAGNOSIS — G8929 Other chronic pain: Secondary | ICD-10-CM | POA: Diagnosis not present

## 2024-01-14 DIAGNOSIS — R29898 Other symptoms and signs involving the musculoskeletal system: Secondary | ICD-10-CM | POA: Diagnosis not present

## 2024-01-14 DIAGNOSIS — R000 Tachycardia, unspecified: Secondary | ICD-10-CM | POA: Diagnosis not present

## 2024-01-14 DIAGNOSIS — G936 Cerebral edema: Secondary | ICD-10-CM | POA: Diagnosis not present

## 2024-01-14 DIAGNOSIS — Z79899 Other long term (current) drug therapy: Secondary | ICD-10-CM | POA: Diagnosis not present

## 2024-01-14 DIAGNOSIS — R262 Difficulty in walking, not elsewhere classified: Secondary | ICD-10-CM | POA: Diagnosis not present

## 2024-01-14 DIAGNOSIS — R69 Illness, unspecified: Secondary | ICD-10-CM | POA: Diagnosis not present

## 2024-01-14 DIAGNOSIS — R64 Cachexia: Secondary | ICD-10-CM | POA: Diagnosis not present

## 2024-01-14 DIAGNOSIS — R4701 Aphasia: Secondary | ICD-10-CM | POA: Diagnosis not present

## 2024-01-14 DIAGNOSIS — Z0189 Encounter for other specified special examinations: Secondary | ICD-10-CM | POA: Diagnosis not present

## 2024-01-14 DIAGNOSIS — R531 Weakness: Secondary | ICD-10-CM | POA: Diagnosis not present

## 2024-01-14 DIAGNOSIS — F17210 Nicotine dependence, cigarettes, uncomplicated: Secondary | ICD-10-CM | POA: Diagnosis not present

## 2024-01-14 DIAGNOSIS — R4789 Other speech disturbances: Secondary | ICD-10-CM | POA: Diagnosis not present

## 2024-01-14 DIAGNOSIS — G4700 Insomnia, unspecified: Secondary | ICD-10-CM | POA: Diagnosis not present

## 2024-01-14 DIAGNOSIS — R488 Other symbolic dysfunctions: Secondary | ICD-10-CM | POA: Diagnosis not present

## 2024-01-14 DIAGNOSIS — Z431 Encounter for attention to gastrostomy: Secondary | ICD-10-CM | POA: Diagnosis not present

## 2024-01-14 DIAGNOSIS — R9431 Abnormal electrocardiogram [ECG] [EKG]: Secondary | ICD-10-CM | POA: Diagnosis not present

## 2024-01-14 DIAGNOSIS — Z681 Body mass index (BMI) 19 or less, adult: Secondary | ICD-10-CM | POA: Diagnosis not present

## 2024-01-14 DIAGNOSIS — E785 Hyperlipidemia, unspecified: Secondary | ICD-10-CM | POA: Diagnosis not present

## 2024-01-15 DIAGNOSIS — G9340 Encephalopathy, unspecified: Secondary | ICD-10-CM | POA: Diagnosis not present

## 2024-01-15 DIAGNOSIS — R9082 White matter disease, unspecified: Secondary | ICD-10-CM | POA: Diagnosis not present

## 2024-01-15 DIAGNOSIS — K219 Gastro-esophageal reflux disease without esophagitis: Secondary | ICD-10-CM | POA: Diagnosis not present

## 2024-01-15 DIAGNOSIS — R1310 Dysphagia, unspecified: Secondary | ICD-10-CM | POA: Diagnosis not present

## 2024-01-15 DIAGNOSIS — R569 Unspecified convulsions: Secondary | ICD-10-CM | POA: Diagnosis not present

## 2024-01-15 DIAGNOSIS — S31000A Unspecified open wound of lower back and pelvis without penetration into retroperitoneum, initial encounter: Secondary | ICD-10-CM | POA: Diagnosis not present

## 2024-01-15 DIAGNOSIS — Z0189 Encounter for other specified special examinations: Secondary | ICD-10-CM | POA: Diagnosis not present

## 2024-01-16 DIAGNOSIS — S31000A Unspecified open wound of lower back and pelvis without penetration into retroperitoneum, initial encounter: Secondary | ICD-10-CM | POA: Diagnosis not present

## 2024-01-16 DIAGNOSIS — K219 Gastro-esophageal reflux disease without esophagitis: Secondary | ICD-10-CM | POA: Diagnosis not present

## 2024-01-16 DIAGNOSIS — G9340 Encephalopathy, unspecified: Secondary | ICD-10-CM | POA: Diagnosis not present

## 2024-01-16 DIAGNOSIS — R1310 Dysphagia, unspecified: Secondary | ICD-10-CM | POA: Diagnosis not present

## 2024-01-17 DIAGNOSIS — S31000A Unspecified open wound of lower back and pelvis without penetration into retroperitoneum, initial encounter: Secondary | ICD-10-CM | POA: Diagnosis not present

## 2024-01-17 DIAGNOSIS — G9340 Encephalopathy, unspecified: Secondary | ICD-10-CM | POA: Diagnosis not present

## 2024-01-17 DIAGNOSIS — K219 Gastro-esophageal reflux disease without esophagitis: Secondary | ICD-10-CM | POA: Diagnosis not present

## 2024-01-17 DIAGNOSIS — R1310 Dysphagia, unspecified: Secondary | ICD-10-CM | POA: Diagnosis not present

## 2024-01-18 DIAGNOSIS — S31000A Unspecified open wound of lower back and pelvis without penetration into retroperitoneum, initial encounter: Secondary | ICD-10-CM | POA: Diagnosis not present

## 2024-01-18 DIAGNOSIS — R1310 Dysphagia, unspecified: Secondary | ICD-10-CM | POA: Diagnosis not present

## 2024-01-18 DIAGNOSIS — K219 Gastro-esophageal reflux disease without esophagitis: Secondary | ICD-10-CM | POA: Diagnosis not present

## 2024-01-19 DIAGNOSIS — R278 Other lack of coordination: Secondary | ICD-10-CM | POA: Diagnosis not present

## 2024-01-19 DIAGNOSIS — R69 Illness, unspecified: Secondary | ICD-10-CM | POA: Diagnosis not present

## 2024-01-19 DIAGNOSIS — S31000A Unspecified open wound of lower back and pelvis without penetration into retroperitoneum, initial encounter: Secondary | ICD-10-CM | POA: Diagnosis not present

## 2024-01-19 DIAGNOSIS — R488 Other symbolic dysfunctions: Secondary | ICD-10-CM | POA: Diagnosis not present

## 2024-01-19 DIAGNOSIS — G4700 Insomnia, unspecified: Secondary | ICD-10-CM | POA: Diagnosis not present

## 2024-01-19 DIAGNOSIS — I10 Essential (primary) hypertension: Secondary | ICD-10-CM | POA: Diagnosis not present

## 2024-01-19 DIAGNOSIS — R262 Difficulty in walking, not elsewhere classified: Secondary | ICD-10-CM | POA: Diagnosis not present

## 2024-01-19 DIAGNOSIS — R4701 Aphasia: Secondary | ICD-10-CM | POA: Diagnosis not present

## 2024-01-19 DIAGNOSIS — R059 Cough, unspecified: Secondary | ICD-10-CM | POA: Diagnosis not present

## 2024-01-19 DIAGNOSIS — R532 Functional quadriplegia: Secondary | ICD-10-CM | POA: Diagnosis not present

## 2024-01-19 DIAGNOSIS — Z931 Gastrostomy status: Secondary | ICD-10-CM | POA: Diagnosis not present

## 2024-01-19 DIAGNOSIS — Z743 Need for continuous supervision: Secondary | ICD-10-CM | POA: Diagnosis not present

## 2024-01-19 DIAGNOSIS — E43 Unspecified severe protein-calorie malnutrition: Secondary | ICD-10-CM | POA: Diagnosis not present

## 2024-01-19 DIAGNOSIS — R52 Pain, unspecified: Secondary | ICD-10-CM | POA: Diagnosis not present

## 2024-01-19 DIAGNOSIS — R1312 Dysphagia, oropharyngeal phase: Secondary | ICD-10-CM | POA: Diagnosis not present

## 2024-01-19 DIAGNOSIS — F17200 Nicotine dependence, unspecified, uncomplicated: Secondary | ICD-10-CM | POA: Diagnosis not present

## 2024-01-19 DIAGNOSIS — Z720 Tobacco use: Secondary | ICD-10-CM | POA: Diagnosis not present

## 2024-01-19 DIAGNOSIS — E785 Hyperlipidemia, unspecified: Secondary | ICD-10-CM | POA: Diagnosis not present

## 2024-01-19 DIAGNOSIS — Z431 Encounter for attention to gastrostomy: Secondary | ICD-10-CM | POA: Diagnosis not present

## 2024-01-19 DIAGNOSIS — Z79899 Other long term (current) drug therapy: Secondary | ICD-10-CM | POA: Diagnosis not present

## 2024-01-19 DIAGNOSIS — K219 Gastro-esophageal reflux disease without esophagitis: Secondary | ICD-10-CM | POA: Diagnosis not present

## 2024-01-19 DIAGNOSIS — J449 Chronic obstructive pulmonary disease, unspecified: Secondary | ICD-10-CM | POA: Diagnosis not present

## 2024-01-19 DIAGNOSIS — E46 Unspecified protein-calorie malnutrition: Secondary | ICD-10-CM | POA: Diagnosis not present

## 2024-01-19 DIAGNOSIS — R1310 Dysphagia, unspecified: Secondary | ICD-10-CM | POA: Diagnosis not present

## 2024-01-19 DIAGNOSIS — L89154 Pressure ulcer of sacral region, stage 4: Secondary | ICD-10-CM | POA: Diagnosis not present

## 2024-01-19 DIAGNOSIS — M6281 Muscle weakness (generalized): Secondary | ICD-10-CM | POA: Diagnosis not present

## 2024-01-21 DIAGNOSIS — E43 Unspecified severe protein-calorie malnutrition: Secondary | ICD-10-CM | POA: Diagnosis not present

## 2024-01-21 DIAGNOSIS — G4700 Insomnia, unspecified: Secondary | ICD-10-CM | POA: Diagnosis not present

## 2024-01-21 DIAGNOSIS — K219 Gastro-esophageal reflux disease without esophagitis: Secondary | ICD-10-CM | POA: Diagnosis not present

## 2024-01-21 DIAGNOSIS — L89154 Pressure ulcer of sacral region, stage 4: Secondary | ICD-10-CM | POA: Diagnosis not present

## 2024-01-21 DIAGNOSIS — F17200 Nicotine dependence, unspecified, uncomplicated: Secondary | ICD-10-CM | POA: Diagnosis not present

## 2024-01-21 DIAGNOSIS — J449 Chronic obstructive pulmonary disease, unspecified: Secondary | ICD-10-CM | POA: Diagnosis not present

## 2024-01-21 DIAGNOSIS — Z79899 Other long term (current) drug therapy: Secondary | ICD-10-CM | POA: Diagnosis not present

## 2024-01-22 DIAGNOSIS — J449 Chronic obstructive pulmonary disease, unspecified: Secondary | ICD-10-CM | POA: Diagnosis not present

## 2024-01-22 DIAGNOSIS — Z720 Tobacco use: Secondary | ICD-10-CM | POA: Diagnosis not present

## 2024-01-22 DIAGNOSIS — E46 Unspecified protein-calorie malnutrition: Secondary | ICD-10-CM | POA: Diagnosis not present

## 2024-01-22 DIAGNOSIS — R1310 Dysphagia, unspecified: Secondary | ICD-10-CM | POA: Diagnosis not present

## 2024-01-22 DIAGNOSIS — K219 Gastro-esophageal reflux disease without esophagitis: Secondary | ICD-10-CM | POA: Diagnosis not present

## 2024-01-22 DIAGNOSIS — E785 Hyperlipidemia, unspecified: Secondary | ICD-10-CM | POA: Diagnosis not present

## 2024-01-23 DIAGNOSIS — E785 Hyperlipidemia, unspecified: Secondary | ICD-10-CM | POA: Diagnosis not present

## 2024-01-23 DIAGNOSIS — J449 Chronic obstructive pulmonary disease, unspecified: Secondary | ICD-10-CM | POA: Diagnosis not present

## 2024-01-23 DIAGNOSIS — K219 Gastro-esophageal reflux disease without esophagitis: Secondary | ICD-10-CM | POA: Diagnosis not present

## 2024-01-23 DIAGNOSIS — G4700 Insomnia, unspecified: Secondary | ICD-10-CM | POA: Diagnosis not present

## 2024-01-23 DIAGNOSIS — R1310 Dysphagia, unspecified: Secondary | ICD-10-CM | POA: Diagnosis not present

## 2024-01-28 DIAGNOSIS — I10 Essential (primary) hypertension: Secondary | ICD-10-CM | POA: Diagnosis not present

## 2024-01-28 DIAGNOSIS — Z431 Encounter for attention to gastrostomy: Secondary | ICD-10-CM | POA: Diagnosis not present

## 2024-01-28 DIAGNOSIS — L89154 Pressure ulcer of sacral region, stage 4: Secondary | ICD-10-CM | POA: Diagnosis not present

## 2024-01-28 DIAGNOSIS — J449 Chronic obstructive pulmonary disease, unspecified: Secondary | ICD-10-CM | POA: Diagnosis not present

## 2024-01-28 DIAGNOSIS — R278 Other lack of coordination: Secondary | ICD-10-CM | POA: Diagnosis not present

## 2024-01-28 DIAGNOSIS — G4700 Insomnia, unspecified: Secondary | ICD-10-CM | POA: Diagnosis not present

## 2024-01-28 DIAGNOSIS — R1312 Dysphagia, oropharyngeal phase: Secondary | ICD-10-CM | POA: Diagnosis not present

## 2024-01-28 DIAGNOSIS — Z931 Gastrostomy status: Secondary | ICD-10-CM | POA: Diagnosis not present

## 2024-01-28 DIAGNOSIS — E785 Hyperlipidemia, unspecified: Secondary | ICD-10-CM | POA: Diagnosis not present

## 2024-01-28 DIAGNOSIS — M6281 Muscle weakness (generalized): Secondary | ICD-10-CM | POA: Diagnosis not present

## 2024-01-28 DIAGNOSIS — K219 Gastro-esophageal reflux disease without esophagitis: Secondary | ICD-10-CM | POA: Diagnosis not present

## 2024-01-29 DIAGNOSIS — Z931 Gastrostomy status: Secondary | ICD-10-CM | POA: Diagnosis not present

## 2024-01-29 DIAGNOSIS — I10 Essential (primary) hypertension: Secondary | ICD-10-CM | POA: Diagnosis not present

## 2024-01-29 DIAGNOSIS — R1310 Dysphagia, unspecified: Secondary | ICD-10-CM | POA: Diagnosis not present

## 2024-01-29 DIAGNOSIS — E46 Unspecified protein-calorie malnutrition: Secondary | ICD-10-CM | POA: Diagnosis not present

## 2024-01-31 DIAGNOSIS — R52 Pain, unspecified: Secondary | ICD-10-CM | POA: Diagnosis not present

## 2024-01-31 DIAGNOSIS — R1310 Dysphagia, unspecified: Secondary | ICD-10-CM | POA: Diagnosis not present

## 2024-02-05 DIAGNOSIS — E46 Unspecified protein-calorie malnutrition: Secondary | ICD-10-CM | POA: Diagnosis not present

## 2024-02-05 DIAGNOSIS — Z931 Gastrostomy status: Secondary | ICD-10-CM | POA: Diagnosis not present

## 2024-02-05 DIAGNOSIS — R059 Cough, unspecified: Secondary | ICD-10-CM | POA: Diagnosis not present

## 2024-02-08 DIAGNOSIS — R059 Cough, unspecified: Secondary | ICD-10-CM | POA: Diagnosis not present

## 2024-02-08 DIAGNOSIS — G4700 Insomnia, unspecified: Secondary | ICD-10-CM | POA: Diagnosis not present

## 2024-02-08 DIAGNOSIS — K219 Gastro-esophageal reflux disease without esophagitis: Secondary | ICD-10-CM | POA: Diagnosis not present

## 2024-02-11 DIAGNOSIS — R1312 Dysphagia, oropharyngeal phase: Secondary | ICD-10-CM | POA: Diagnosis not present

## 2024-02-11 DIAGNOSIS — E785 Hyperlipidemia, unspecified: Secondary | ICD-10-CM | POA: Diagnosis not present

## 2024-02-11 DIAGNOSIS — M6281 Muscle weakness (generalized): Secondary | ICD-10-CM | POA: Diagnosis not present

## 2024-02-11 DIAGNOSIS — J449 Chronic obstructive pulmonary disease, unspecified: Secondary | ICD-10-CM | POA: Diagnosis not present

## 2024-02-11 DIAGNOSIS — R278 Other lack of coordination: Secondary | ICD-10-CM | POA: Diagnosis not present

## 2024-02-11 DIAGNOSIS — G4700 Insomnia, unspecified: Secondary | ICD-10-CM | POA: Diagnosis not present

## 2024-02-11 DIAGNOSIS — I10 Essential (primary) hypertension: Secondary | ICD-10-CM | POA: Diagnosis not present

## 2024-02-11 DIAGNOSIS — K219 Gastro-esophageal reflux disease without esophagitis: Secondary | ICD-10-CM | POA: Diagnosis not present

## 2024-02-11 DIAGNOSIS — Z931 Gastrostomy status: Secondary | ICD-10-CM | POA: Diagnosis not present

## 2024-02-11 DIAGNOSIS — Z431 Encounter for attention to gastrostomy: Secondary | ICD-10-CM | POA: Diagnosis not present

## 2024-02-13 DIAGNOSIS — K219 Gastro-esophageal reflux disease without esophagitis: Secondary | ICD-10-CM | POA: Diagnosis not present

## 2024-02-13 DIAGNOSIS — R4701 Aphasia: Secondary | ICD-10-CM | POA: Diagnosis not present

## 2024-02-13 DIAGNOSIS — R52 Pain, unspecified: Secondary | ICD-10-CM | POA: Diagnosis not present

## 2024-02-13 DIAGNOSIS — G4700 Insomnia, unspecified: Secondary | ICD-10-CM | POA: Diagnosis not present

## 2024-02-15 DIAGNOSIS — G4700 Insomnia, unspecified: Secondary | ICD-10-CM | POA: Diagnosis not present

## 2024-02-15 DIAGNOSIS — R4701 Aphasia: Secondary | ICD-10-CM | POA: Diagnosis not present

## 2024-02-15 DIAGNOSIS — R52 Pain, unspecified: Secondary | ICD-10-CM | POA: Diagnosis not present

## 2024-02-15 DIAGNOSIS — K219 Gastro-esophageal reflux disease without esophagitis: Secondary | ICD-10-CM | POA: Diagnosis not present

## 2024-02-19 DIAGNOSIS — M109 Gout, unspecified: Secondary | ICD-10-CM | POA: Diagnosis not present

## 2024-02-19 DIAGNOSIS — E785 Hyperlipidemia, unspecified: Secondary | ICD-10-CM | POA: Diagnosis not present

## 2024-02-19 DIAGNOSIS — J449 Chronic obstructive pulmonary disease, unspecified: Secondary | ICD-10-CM | POA: Diagnosis not present

## 2024-02-19 DIAGNOSIS — Z79899 Other long term (current) drug therapy: Secondary | ICD-10-CM | POA: Diagnosis not present

## 2024-02-19 DIAGNOSIS — Z681 Body mass index (BMI) 19 or less, adult: Secondary | ICD-10-CM | POA: Diagnosis not present

## 2024-02-20 DIAGNOSIS — R4701 Aphasia: Secondary | ICD-10-CM | POA: Diagnosis not present

## 2024-02-20 DIAGNOSIS — R251 Tremor, unspecified: Secondary | ICD-10-CM | POA: Diagnosis not present

## 2024-02-20 DIAGNOSIS — G4700 Insomnia, unspecified: Secondary | ICD-10-CM | POA: Diagnosis not present

## 2024-02-20 DIAGNOSIS — R532 Functional quadriplegia: Secondary | ICD-10-CM | POA: Diagnosis not present

## 2024-02-20 DIAGNOSIS — E785 Hyperlipidemia, unspecified: Secondary | ICD-10-CM | POA: Diagnosis not present

## 2024-02-20 DIAGNOSIS — R1312 Dysphagia, oropharyngeal phase: Secondary | ICD-10-CM | POA: Diagnosis not present

## 2024-02-20 DIAGNOSIS — Z431 Encounter for attention to gastrostomy: Secondary | ICD-10-CM | POA: Diagnosis not present

## 2024-02-20 DIAGNOSIS — E46 Unspecified protein-calorie malnutrition: Secondary | ICD-10-CM | POA: Diagnosis not present

## 2024-02-20 DIAGNOSIS — M4802 Spinal stenosis, cervical region: Secondary | ICD-10-CM | POA: Diagnosis not present

## 2024-02-20 DIAGNOSIS — M1990 Unspecified osteoarthritis, unspecified site: Secondary | ICD-10-CM | POA: Diagnosis not present

## 2024-02-20 DIAGNOSIS — I10 Essential (primary) hypertension: Secondary | ICD-10-CM | POA: Diagnosis not present

## 2024-02-20 DIAGNOSIS — Z791 Long term (current) use of non-steroidal anti-inflammatories (NSAID): Secondary | ICD-10-CM | POA: Diagnosis not present

## 2024-02-20 DIAGNOSIS — Z720 Tobacco use: Secondary | ICD-10-CM | POA: Diagnosis not present

## 2024-02-20 DIAGNOSIS — J4489 Other specified chronic obstructive pulmonary disease: Secondary | ICD-10-CM | POA: Diagnosis not present

## 2024-02-22 DIAGNOSIS — R569 Unspecified convulsions: Secondary | ICD-10-CM | POA: Diagnosis not present

## 2024-02-22 DIAGNOSIS — Z791 Long term (current) use of non-steroidal anti-inflammatories (NSAID): Secondary | ICD-10-CM | POA: Diagnosis not present

## 2024-02-22 DIAGNOSIS — Z8669 Personal history of other diseases of the nervous system and sense organs: Secondary | ICD-10-CM | POA: Diagnosis not present

## 2024-02-22 DIAGNOSIS — R9401 Abnormal electroencephalogram [EEG]: Secondary | ICD-10-CM | POA: Diagnosis not present

## 2024-02-22 DIAGNOSIS — Z431 Encounter for attention to gastrostomy: Secondary | ICD-10-CM | POA: Diagnosis not present

## 2024-02-22 DIAGNOSIS — J439 Emphysema, unspecified: Secondary | ICD-10-CM | POA: Diagnosis not present

## 2024-02-22 DIAGNOSIS — I6782 Cerebral ischemia: Secondary | ICD-10-CM | POA: Diagnosis not present

## 2024-02-22 DIAGNOSIS — R000 Tachycardia, unspecified: Secondary | ICD-10-CM | POA: Diagnosis not present

## 2024-02-22 DIAGNOSIS — R404 Transient alteration of awareness: Secondary | ICD-10-CM | POA: Diagnosis not present

## 2024-02-22 DIAGNOSIS — E876 Hypokalemia: Secondary | ICD-10-CM | POA: Diagnosis not present

## 2024-02-22 DIAGNOSIS — M6284 Sarcopenia: Secondary | ICD-10-CM | POA: Diagnosis not present

## 2024-02-22 DIAGNOSIS — Z681 Body mass index (BMI) 19 or less, adult: Secondary | ICD-10-CM | POA: Diagnosis not present

## 2024-02-22 DIAGNOSIS — R531 Weakness: Secondary | ICD-10-CM | POA: Diagnosis not present

## 2024-02-22 DIAGNOSIS — I493 Ventricular premature depolarization: Secondary | ICD-10-CM | POA: Diagnosis not present

## 2024-02-22 DIAGNOSIS — E860 Dehydration: Secondary | ICD-10-CM | POA: Diagnosis not present

## 2024-02-22 DIAGNOSIS — R4701 Aphasia: Secondary | ICD-10-CM | POA: Diagnosis not present

## 2024-02-22 DIAGNOSIS — L89153 Pressure ulcer of sacral region, stage 3: Secondary | ICD-10-CM | POA: Diagnosis not present

## 2024-02-22 DIAGNOSIS — G4700 Insomnia, unspecified: Secondary | ICD-10-CM | POA: Diagnosis not present

## 2024-02-22 DIAGNOSIS — R636 Underweight: Secondary | ICD-10-CM | POA: Diagnosis not present

## 2024-02-22 DIAGNOSIS — G9389 Other specified disorders of brain: Secondary | ICD-10-CM | POA: Diagnosis not present

## 2024-02-22 DIAGNOSIS — Z931 Gastrostomy status: Secondary | ICD-10-CM | POA: Diagnosis not present

## 2024-02-22 DIAGNOSIS — J4489 Other specified chronic obstructive pulmonary disease: Secondary | ICD-10-CM | POA: Diagnosis not present

## 2024-02-22 DIAGNOSIS — R29898 Other symptoms and signs involving the musculoskeletal system: Secondary | ICD-10-CM | POA: Diagnosis not present

## 2024-02-22 DIAGNOSIS — R1310 Dysphagia, unspecified: Secondary | ICD-10-CM | POA: Diagnosis not present

## 2024-02-22 DIAGNOSIS — Z720 Tobacco use: Secondary | ICD-10-CM | POA: Diagnosis not present

## 2024-02-22 DIAGNOSIS — I10 Essential (primary) hypertension: Secondary | ICD-10-CM | POA: Diagnosis not present

## 2024-02-22 DIAGNOSIS — Z7409 Other reduced mobility: Secondary | ICD-10-CM | POA: Diagnosis not present

## 2024-02-22 DIAGNOSIS — Z7401 Bed confinement status: Secondary | ICD-10-CM | POA: Diagnosis not present

## 2024-02-22 DIAGNOSIS — I639 Cerebral infarction, unspecified: Secondary | ICD-10-CM | POA: Diagnosis not present

## 2024-02-22 DIAGNOSIS — E46 Unspecified protein-calorie malnutrition: Secondary | ICD-10-CM | POA: Diagnosis not present

## 2024-02-22 DIAGNOSIS — R1312 Dysphagia, oropharyngeal phase: Secondary | ICD-10-CM | POA: Diagnosis not present

## 2024-02-22 DIAGNOSIS — Z8673 Personal history of transient ischemic attack (TIA), and cerebral infarction without residual deficits: Secondary | ICD-10-CM | POA: Diagnosis not present

## 2024-02-22 DIAGNOSIS — R64 Cachexia: Secondary | ICD-10-CM | POA: Diagnosis not present

## 2024-02-22 DIAGNOSIS — G9340 Encephalopathy, unspecified: Secondary | ICD-10-CM | POA: Diagnosis not present

## 2024-02-22 DIAGNOSIS — M1990 Unspecified osteoarthritis, unspecified site: Secondary | ICD-10-CM | POA: Diagnosis not present

## 2024-02-22 DIAGNOSIS — R29818 Other symptoms and signs involving the nervous system: Secondary | ICD-10-CM | POA: Diagnosis not present

## 2024-02-22 DIAGNOSIS — R251 Tremor, unspecified: Secondary | ICD-10-CM | POA: Diagnosis not present

## 2024-02-22 DIAGNOSIS — M4802 Spinal stenosis, cervical region: Secondary | ICD-10-CM | POA: Diagnosis not present

## 2024-02-22 DIAGNOSIS — R532 Functional quadriplegia: Secondary | ICD-10-CM | POA: Diagnosis not present

## 2024-02-22 DIAGNOSIS — Z79899 Other long term (current) drug therapy: Secondary | ICD-10-CM | POA: Diagnosis not present

## 2024-02-22 DIAGNOSIS — Z781 Physical restraint status: Secondary | ICD-10-CM | POA: Diagnosis not present

## 2024-02-22 DIAGNOSIS — I959 Hypotension, unspecified: Secondary | ICD-10-CM | POA: Diagnosis not present

## 2024-02-22 DIAGNOSIS — I499 Cardiac arrhythmia, unspecified: Secondary | ICD-10-CM | POA: Diagnosis not present

## 2024-02-22 DIAGNOSIS — E785 Hyperlipidemia, unspecified: Secondary | ICD-10-CM | POA: Diagnosis not present

## 2024-02-23 DIAGNOSIS — R4701 Aphasia: Secondary | ICD-10-CM | POA: Diagnosis not present

## 2024-02-24 DIAGNOSIS — R569 Unspecified convulsions: Secondary | ICD-10-CM | POA: Diagnosis not present

## 2024-02-24 DIAGNOSIS — Z931 Gastrostomy status: Secondary | ICD-10-CM | POA: Diagnosis not present

## 2024-02-24 DIAGNOSIS — R9401 Abnormal electroencephalogram [EEG]: Secondary | ICD-10-CM | POA: Diagnosis not present

## 2024-02-24 DIAGNOSIS — R4701 Aphasia: Secondary | ICD-10-CM | POA: Diagnosis not present

## 2024-02-25 DIAGNOSIS — G9389 Other specified disorders of brain: Secondary | ICD-10-CM | POA: Diagnosis not present

## 2024-02-25 DIAGNOSIS — R9401 Abnormal electroencephalogram [EEG]: Secondary | ICD-10-CM | POA: Diagnosis not present

## 2024-02-25 DIAGNOSIS — R4701 Aphasia: Secondary | ICD-10-CM | POA: Diagnosis not present

## 2024-02-25 DIAGNOSIS — Z8669 Personal history of other diseases of the nervous system and sense organs: Secondary | ICD-10-CM | POA: Diagnosis not present

## 2024-02-25 DIAGNOSIS — R569 Unspecified convulsions: Secondary | ICD-10-CM | POA: Diagnosis not present

## 2024-02-26 DIAGNOSIS — Z8669 Personal history of other diseases of the nervous system and sense organs: Secondary | ICD-10-CM | POA: Diagnosis not present

## 2024-02-26 DIAGNOSIS — R569 Unspecified convulsions: Secondary | ICD-10-CM | POA: Diagnosis not present

## 2024-02-26 DIAGNOSIS — R9401 Abnormal electroencephalogram [EEG]: Secondary | ICD-10-CM | POA: Diagnosis not present

## 2024-02-26 DIAGNOSIS — R4701 Aphasia: Secondary | ICD-10-CM | POA: Diagnosis not present

## 2024-02-26 DIAGNOSIS — G9389 Other specified disorders of brain: Secondary | ICD-10-CM | POA: Diagnosis not present

## 2024-02-27 DIAGNOSIS — G9389 Other specified disorders of brain: Secondary | ICD-10-CM | POA: Diagnosis not present

## 2024-02-27 DIAGNOSIS — R4701 Aphasia: Secondary | ICD-10-CM | POA: Diagnosis not present

## 2024-02-27 DIAGNOSIS — R9401 Abnormal electroencephalogram [EEG]: Secondary | ICD-10-CM | POA: Diagnosis not present

## 2024-02-27 DIAGNOSIS — R569 Unspecified convulsions: Secondary | ICD-10-CM | POA: Diagnosis not present

## 2024-02-28 DIAGNOSIS — R4701 Aphasia: Secondary | ICD-10-CM | POA: Diagnosis not present

## 2024-02-28 DIAGNOSIS — G9389 Other specified disorders of brain: Secondary | ICD-10-CM | POA: Diagnosis not present

## 2024-02-28 DIAGNOSIS — R569 Unspecified convulsions: Secondary | ICD-10-CM | POA: Diagnosis not present

## 2024-02-28 DIAGNOSIS — R9401 Abnormal electroencephalogram [EEG]: Secondary | ICD-10-CM | POA: Diagnosis not present

## 2024-02-29 DIAGNOSIS — R569 Unspecified convulsions: Secondary | ICD-10-CM | POA: Diagnosis not present

## 2024-02-29 DIAGNOSIS — R4701 Aphasia: Secondary | ICD-10-CM | POA: Diagnosis not present

## 2024-03-01 DIAGNOSIS — R4701 Aphasia: Secondary | ICD-10-CM | POA: Diagnosis not present

## 2024-03-02 DIAGNOSIS — R4701 Aphasia: Secondary | ICD-10-CM | POA: Diagnosis not present

## 2024-03-03 DIAGNOSIS — R569 Unspecified convulsions: Secondary | ICD-10-CM | POA: Diagnosis not present

## 2024-03-03 DIAGNOSIS — R4701 Aphasia: Secondary | ICD-10-CM | POA: Diagnosis not present

## 2024-03-04 DIAGNOSIS — R4701 Aphasia: Secondary | ICD-10-CM | POA: Diagnosis not present

## 2024-03-05 DIAGNOSIS — R4701 Aphasia: Secondary | ICD-10-CM | POA: Diagnosis not present

## 2024-03-06 DIAGNOSIS — R4701 Aphasia: Secondary | ICD-10-CM | POA: Diagnosis not present

## 2024-03-07 DIAGNOSIS — R4701 Aphasia: Secondary | ICD-10-CM | POA: Diagnosis not present

## 2024-03-08 DIAGNOSIS — R4701 Aphasia: Secondary | ICD-10-CM | POA: Diagnosis not present

## 2024-03-19 DIAGNOSIS — Z7409 Other reduced mobility: Secondary | ICD-10-CM | POA: Diagnosis not present

## 2024-03-19 DIAGNOSIS — E876 Hypokalemia: Secondary | ICD-10-CM | POA: Diagnosis not present

## 2024-03-24 DIAGNOSIS — F17200 Nicotine dependence, unspecified, uncomplicated: Secondary | ICD-10-CM | POA: Diagnosis not present

## 2024-03-24 DIAGNOSIS — L89154 Pressure ulcer of sacral region, stage 4: Secondary | ICD-10-CM | POA: Diagnosis not present

## 2024-03-24 DIAGNOSIS — E43 Unspecified severe protein-calorie malnutrition: Secondary | ICD-10-CM | POA: Diagnosis not present

## 2024-03-24 DIAGNOSIS — J449 Chronic obstructive pulmonary disease, unspecified: Secondary | ICD-10-CM | POA: Diagnosis not present

## 2024-03-27 DIAGNOSIS — R770 Abnormality of albumin: Secondary | ICD-10-CM | POA: Diagnosis not present

## 2024-03-31 DIAGNOSIS — L89154 Pressure ulcer of sacral region, stage 4: Secondary | ICD-10-CM | POA: Diagnosis not present

## 2024-03-31 DIAGNOSIS — F17200 Nicotine dependence, unspecified, uncomplicated: Secondary | ICD-10-CM | POA: Diagnosis not present

## 2024-03-31 DIAGNOSIS — J449 Chronic obstructive pulmonary disease, unspecified: Secondary | ICD-10-CM | POA: Diagnosis not present

## 2024-03-31 DIAGNOSIS — E43 Unspecified severe protein-calorie malnutrition: Secondary | ICD-10-CM | POA: Diagnosis not present

## 2024-04-05 DIAGNOSIS — E43 Unspecified severe protein-calorie malnutrition: Secondary | ICD-10-CM | POA: Diagnosis not present

## 2024-04-07 DIAGNOSIS — E43 Unspecified severe protein-calorie malnutrition: Secondary | ICD-10-CM | POA: Diagnosis not present

## 2024-04-07 DIAGNOSIS — F17200 Nicotine dependence, unspecified, uncomplicated: Secondary | ICD-10-CM | POA: Diagnosis not present

## 2024-04-07 DIAGNOSIS — L89154 Pressure ulcer of sacral region, stage 4: Secondary | ICD-10-CM | POA: Diagnosis not present

## 2024-04-07 DIAGNOSIS — J449 Chronic obstructive pulmonary disease, unspecified: Secondary | ICD-10-CM | POA: Diagnosis not present

## 2024-04-14 DIAGNOSIS — L89154 Pressure ulcer of sacral region, stage 4: Secondary | ICD-10-CM | POA: Diagnosis not present

## 2024-04-14 DIAGNOSIS — L0889 Other specified local infections of the skin and subcutaneous tissue: Secondary | ICD-10-CM | POA: Diagnosis not present

## 2024-04-16 DIAGNOSIS — R251 Tremor, unspecified: Secondary | ICD-10-CM | POA: Diagnosis not present

## 2024-04-16 DIAGNOSIS — J45909 Unspecified asthma, uncomplicated: Secondary | ICD-10-CM | POA: Diagnosis not present

## 2024-04-16 DIAGNOSIS — E43 Unspecified severe protein-calorie malnutrition: Secondary | ICD-10-CM | POA: Diagnosis not present

## 2024-04-16 DIAGNOSIS — Z8673 Personal history of transient ischemic attack (TIA), and cerebral infarction without residual deficits: Secondary | ICD-10-CM | POA: Diagnosis not present

## 2024-04-16 DIAGNOSIS — M6281 Muscle weakness (generalized): Secondary | ICD-10-CM | POA: Diagnosis not present

## 2024-04-16 DIAGNOSIS — I10 Essential (primary) hypertension: Secondary | ICD-10-CM | POA: Diagnosis not present

## 2024-04-16 DIAGNOSIS — I69920 Aphasia following unspecified cerebrovascular disease: Secondary | ICD-10-CM | POA: Diagnosis not present

## 2024-04-16 DIAGNOSIS — G992 Myelopathy in diseases classified elsewhere: Secondary | ICD-10-CM | POA: Diagnosis not present

## 2024-04-16 DIAGNOSIS — M6284 Sarcopenia: Secondary | ICD-10-CM | POA: Diagnosis not present

## 2024-04-16 DIAGNOSIS — M4802 Spinal stenosis, cervical region: Secondary | ICD-10-CM | POA: Diagnosis not present

## 2024-04-16 DIAGNOSIS — J449 Chronic obstructive pulmonary disease, unspecified: Secondary | ICD-10-CM | POA: Diagnosis not present

## 2024-04-16 DIAGNOSIS — R279 Unspecified lack of coordination: Secondary | ICD-10-CM | POA: Diagnosis not present

## 2024-04-16 DIAGNOSIS — R64 Cachexia: Secondary | ICD-10-CM | POA: Diagnosis not present

## 2024-04-16 DIAGNOSIS — I69851 Hemiplegia and hemiparesis following other cerebrovascular disease affecting right dominant side: Secondary | ICD-10-CM | POA: Diagnosis not present

## 2024-04-16 DIAGNOSIS — D649 Anemia, unspecified: Secondary | ICD-10-CM | POA: Diagnosis not present

## 2024-04-17 DIAGNOSIS — D649 Anemia, unspecified: Secondary | ICD-10-CM | POA: Diagnosis not present

## 2024-04-17 DIAGNOSIS — J449 Chronic obstructive pulmonary disease, unspecified: Secondary | ICD-10-CM | POA: Diagnosis not present

## 2024-04-17 DIAGNOSIS — R64 Cachexia: Secondary | ICD-10-CM | POA: Diagnosis not present

## 2024-04-17 DIAGNOSIS — R251 Tremor, unspecified: Secondary | ICD-10-CM | POA: Diagnosis not present

## 2024-04-17 DIAGNOSIS — Z8673 Personal history of transient ischemic attack (TIA), and cerebral infarction without residual deficits: Secondary | ICD-10-CM | POA: Diagnosis not present

## 2024-04-17 DIAGNOSIS — M6281 Muscle weakness (generalized): Secondary | ICD-10-CM | POA: Diagnosis not present

## 2024-04-17 DIAGNOSIS — R279 Unspecified lack of coordination: Secondary | ICD-10-CM | POA: Diagnosis not present

## 2024-04-17 DIAGNOSIS — M6284 Sarcopenia: Secondary | ICD-10-CM | POA: Diagnosis not present

## 2024-04-17 DIAGNOSIS — I69920 Aphasia following unspecified cerebrovascular disease: Secondary | ICD-10-CM | POA: Diagnosis not present

## 2024-04-17 DIAGNOSIS — I69851 Hemiplegia and hemiparesis following other cerebrovascular disease affecting right dominant side: Secondary | ICD-10-CM | POA: Diagnosis not present

## 2024-04-17 DIAGNOSIS — G992 Myelopathy in diseases classified elsewhere: Secondary | ICD-10-CM | POA: Diagnosis not present

## 2024-04-17 DIAGNOSIS — I10 Essential (primary) hypertension: Secondary | ICD-10-CM | POA: Diagnosis not present

## 2024-04-17 DIAGNOSIS — E43 Unspecified severe protein-calorie malnutrition: Secondary | ICD-10-CM | POA: Diagnosis not present

## 2024-04-17 DIAGNOSIS — M4802 Spinal stenosis, cervical region: Secondary | ICD-10-CM | POA: Diagnosis not present

## 2024-04-17 DIAGNOSIS — J45909 Unspecified asthma, uncomplicated: Secondary | ICD-10-CM | POA: Diagnosis not present

## 2024-04-18 DIAGNOSIS — M6284 Sarcopenia: Secondary | ICD-10-CM | POA: Diagnosis not present

## 2024-04-18 DIAGNOSIS — E43 Unspecified severe protein-calorie malnutrition: Secondary | ICD-10-CM | POA: Diagnosis not present

## 2024-04-18 DIAGNOSIS — I69920 Aphasia following unspecified cerebrovascular disease: Secondary | ICD-10-CM | POA: Diagnosis not present

## 2024-04-18 DIAGNOSIS — R64 Cachexia: Secondary | ICD-10-CM | POA: Diagnosis not present

## 2024-04-18 DIAGNOSIS — I10 Essential (primary) hypertension: Secondary | ICD-10-CM | POA: Diagnosis not present

## 2024-04-18 DIAGNOSIS — D649 Anemia, unspecified: Secondary | ICD-10-CM | POA: Diagnosis not present

## 2024-04-18 DIAGNOSIS — J45909 Unspecified asthma, uncomplicated: Secondary | ICD-10-CM | POA: Diagnosis not present

## 2024-04-18 DIAGNOSIS — R251 Tremor, unspecified: Secondary | ICD-10-CM | POA: Diagnosis not present

## 2024-04-18 DIAGNOSIS — I69851 Hemiplegia and hemiparesis following other cerebrovascular disease affecting right dominant side: Secondary | ICD-10-CM | POA: Diagnosis not present

## 2024-04-18 DIAGNOSIS — Z8673 Personal history of transient ischemic attack (TIA), and cerebral infarction without residual deficits: Secondary | ICD-10-CM | POA: Diagnosis not present

## 2024-04-18 DIAGNOSIS — J449 Chronic obstructive pulmonary disease, unspecified: Secondary | ICD-10-CM | POA: Diagnosis not present

## 2024-04-18 DIAGNOSIS — M6281 Muscle weakness (generalized): Secondary | ICD-10-CM | POA: Diagnosis not present

## 2024-04-18 DIAGNOSIS — R279 Unspecified lack of coordination: Secondary | ICD-10-CM | POA: Diagnosis not present

## 2024-04-18 DIAGNOSIS — G992 Myelopathy in diseases classified elsewhere: Secondary | ICD-10-CM | POA: Diagnosis not present

## 2024-04-18 DIAGNOSIS — M4802 Spinal stenosis, cervical region: Secondary | ICD-10-CM | POA: Diagnosis not present

## 2024-04-19 DIAGNOSIS — E43 Unspecified severe protein-calorie malnutrition: Secondary | ICD-10-CM | POA: Diagnosis not present

## 2024-04-19 DIAGNOSIS — R279 Unspecified lack of coordination: Secondary | ICD-10-CM | POA: Diagnosis not present

## 2024-04-19 DIAGNOSIS — J449 Chronic obstructive pulmonary disease, unspecified: Secondary | ICD-10-CM | POA: Diagnosis not present

## 2024-04-19 DIAGNOSIS — R251 Tremor, unspecified: Secondary | ICD-10-CM | POA: Diagnosis not present

## 2024-04-19 DIAGNOSIS — R64 Cachexia: Secondary | ICD-10-CM | POA: Diagnosis not present

## 2024-04-19 DIAGNOSIS — I10 Essential (primary) hypertension: Secondary | ICD-10-CM | POA: Diagnosis not present

## 2024-04-19 DIAGNOSIS — M4802 Spinal stenosis, cervical region: Secondary | ICD-10-CM | POA: Diagnosis not present

## 2024-04-19 DIAGNOSIS — D649 Anemia, unspecified: Secondary | ICD-10-CM | POA: Diagnosis not present

## 2024-04-19 DIAGNOSIS — J45909 Unspecified asthma, uncomplicated: Secondary | ICD-10-CM | POA: Diagnosis not present

## 2024-04-19 DIAGNOSIS — Z8673 Personal history of transient ischemic attack (TIA), and cerebral infarction without residual deficits: Secondary | ICD-10-CM | POA: Diagnosis not present

## 2024-04-19 DIAGNOSIS — M6281 Muscle weakness (generalized): Secondary | ICD-10-CM | POA: Diagnosis not present

## 2024-04-19 DIAGNOSIS — G992 Myelopathy in diseases classified elsewhere: Secondary | ICD-10-CM | POA: Diagnosis not present

## 2024-04-19 DIAGNOSIS — M6284 Sarcopenia: Secondary | ICD-10-CM | POA: Diagnosis not present

## 2024-04-19 DIAGNOSIS — I69920 Aphasia following unspecified cerebrovascular disease: Secondary | ICD-10-CM | POA: Diagnosis not present

## 2024-04-19 DIAGNOSIS — I69851 Hemiplegia and hemiparesis following other cerebrovascular disease affecting right dominant side: Secondary | ICD-10-CM | POA: Diagnosis not present

## 2024-04-20 DIAGNOSIS — I10 Essential (primary) hypertension: Secondary | ICD-10-CM | POA: Diagnosis not present

## 2024-04-20 DIAGNOSIS — J45909 Unspecified asthma, uncomplicated: Secondary | ICD-10-CM | POA: Diagnosis not present

## 2024-04-20 DIAGNOSIS — R64 Cachexia: Secondary | ICD-10-CM | POA: Diagnosis not present

## 2024-04-20 DIAGNOSIS — M4802 Spinal stenosis, cervical region: Secondary | ICD-10-CM | POA: Diagnosis not present

## 2024-04-20 DIAGNOSIS — M6281 Muscle weakness (generalized): Secondary | ICD-10-CM | POA: Diagnosis not present

## 2024-04-20 DIAGNOSIS — G992 Myelopathy in diseases classified elsewhere: Secondary | ICD-10-CM | POA: Diagnosis not present

## 2024-04-20 DIAGNOSIS — J449 Chronic obstructive pulmonary disease, unspecified: Secondary | ICD-10-CM | POA: Diagnosis not present

## 2024-04-20 DIAGNOSIS — I69920 Aphasia following unspecified cerebrovascular disease: Secondary | ICD-10-CM | POA: Diagnosis not present

## 2024-04-20 DIAGNOSIS — D649 Anemia, unspecified: Secondary | ICD-10-CM | POA: Diagnosis not present

## 2024-04-20 DIAGNOSIS — M6284 Sarcopenia: Secondary | ICD-10-CM | POA: Diagnosis not present

## 2024-04-20 DIAGNOSIS — I69851 Hemiplegia and hemiparesis following other cerebrovascular disease affecting right dominant side: Secondary | ICD-10-CM | POA: Diagnosis not present

## 2024-04-20 DIAGNOSIS — Z8673 Personal history of transient ischemic attack (TIA), and cerebral infarction without residual deficits: Secondary | ICD-10-CM | POA: Diagnosis not present

## 2024-04-20 DIAGNOSIS — E43 Unspecified severe protein-calorie malnutrition: Secondary | ICD-10-CM | POA: Diagnosis not present

## 2024-04-20 DIAGNOSIS — R279 Unspecified lack of coordination: Secondary | ICD-10-CM | POA: Diagnosis not present

## 2024-04-20 DIAGNOSIS — R251 Tremor, unspecified: Secondary | ICD-10-CM | POA: Diagnosis not present

## 2024-04-21 DIAGNOSIS — I69920 Aphasia following unspecified cerebrovascular disease: Secondary | ICD-10-CM | POA: Diagnosis not present

## 2024-04-21 DIAGNOSIS — J449 Chronic obstructive pulmonary disease, unspecified: Secondary | ICD-10-CM | POA: Diagnosis not present

## 2024-04-21 DIAGNOSIS — L89154 Pressure ulcer of sacral region, stage 4: Secondary | ICD-10-CM | POA: Diagnosis not present

## 2024-04-21 DIAGNOSIS — E43 Unspecified severe protein-calorie malnutrition: Secondary | ICD-10-CM | POA: Diagnosis not present

## 2024-04-21 DIAGNOSIS — R251 Tremor, unspecified: Secondary | ICD-10-CM | POA: Diagnosis not present

## 2024-04-21 DIAGNOSIS — R279 Unspecified lack of coordination: Secondary | ICD-10-CM | POA: Diagnosis not present

## 2024-04-21 DIAGNOSIS — D649 Anemia, unspecified: Secondary | ICD-10-CM | POA: Diagnosis not present

## 2024-04-21 DIAGNOSIS — I69851 Hemiplegia and hemiparesis following other cerebrovascular disease affecting right dominant side: Secondary | ICD-10-CM | POA: Diagnosis not present

## 2024-04-21 DIAGNOSIS — M6284 Sarcopenia: Secondary | ICD-10-CM | POA: Diagnosis not present

## 2024-04-21 DIAGNOSIS — M6281 Muscle weakness (generalized): Secondary | ICD-10-CM | POA: Diagnosis not present

## 2024-04-21 DIAGNOSIS — M4802 Spinal stenosis, cervical region: Secondary | ICD-10-CM | POA: Diagnosis not present

## 2024-04-21 DIAGNOSIS — I10 Essential (primary) hypertension: Secondary | ICD-10-CM | POA: Diagnosis not present

## 2024-04-21 DIAGNOSIS — G992 Myelopathy in diseases classified elsewhere: Secondary | ICD-10-CM | POA: Diagnosis not present

## 2024-04-21 DIAGNOSIS — Z8673 Personal history of transient ischemic attack (TIA), and cerebral infarction without residual deficits: Secondary | ICD-10-CM | POA: Diagnosis not present

## 2024-04-21 DIAGNOSIS — R64 Cachexia: Secondary | ICD-10-CM | POA: Diagnosis not present

## 2024-04-21 DIAGNOSIS — J45909 Unspecified asthma, uncomplicated: Secondary | ICD-10-CM | POA: Diagnosis not present

## 2024-04-22 DIAGNOSIS — G992 Myelopathy in diseases classified elsewhere: Secondary | ICD-10-CM | POA: Diagnosis not present

## 2024-04-22 DIAGNOSIS — I10 Essential (primary) hypertension: Secondary | ICD-10-CM | POA: Diagnosis not present

## 2024-04-22 DIAGNOSIS — Z8673 Personal history of transient ischemic attack (TIA), and cerebral infarction without residual deficits: Secondary | ICD-10-CM | POA: Diagnosis not present

## 2024-04-22 DIAGNOSIS — R64 Cachexia: Secondary | ICD-10-CM | POA: Diagnosis not present

## 2024-04-22 DIAGNOSIS — D649 Anemia, unspecified: Secondary | ICD-10-CM | POA: Diagnosis not present

## 2024-04-22 DIAGNOSIS — I69920 Aphasia following unspecified cerebrovascular disease: Secondary | ICD-10-CM | POA: Diagnosis not present

## 2024-04-22 DIAGNOSIS — E43 Unspecified severe protein-calorie malnutrition: Secondary | ICD-10-CM | POA: Diagnosis not present

## 2024-04-22 DIAGNOSIS — R279 Unspecified lack of coordination: Secondary | ICD-10-CM | POA: Diagnosis not present

## 2024-04-22 DIAGNOSIS — J449 Chronic obstructive pulmonary disease, unspecified: Secondary | ICD-10-CM | POA: Diagnosis not present

## 2024-04-22 DIAGNOSIS — I69851 Hemiplegia and hemiparesis following other cerebrovascular disease affecting right dominant side: Secondary | ICD-10-CM | POA: Diagnosis not present

## 2024-04-22 DIAGNOSIS — R251 Tremor, unspecified: Secondary | ICD-10-CM | POA: Diagnosis not present

## 2024-04-22 DIAGNOSIS — M4802 Spinal stenosis, cervical region: Secondary | ICD-10-CM | POA: Diagnosis not present

## 2024-04-22 DIAGNOSIS — M6281 Muscle weakness (generalized): Secondary | ICD-10-CM | POA: Diagnosis not present

## 2024-04-22 DIAGNOSIS — J45909 Unspecified asthma, uncomplicated: Secondary | ICD-10-CM | POA: Diagnosis not present

## 2024-04-22 DIAGNOSIS — M6284 Sarcopenia: Secondary | ICD-10-CM | POA: Diagnosis not present

## 2024-04-23 DIAGNOSIS — I10 Essential (primary) hypertension: Secondary | ICD-10-CM | POA: Diagnosis not present

## 2024-04-23 DIAGNOSIS — R279 Unspecified lack of coordination: Secondary | ICD-10-CM | POA: Diagnosis not present

## 2024-04-23 DIAGNOSIS — I69920 Aphasia following unspecified cerebrovascular disease: Secondary | ICD-10-CM | POA: Diagnosis not present

## 2024-04-23 DIAGNOSIS — Z8673 Personal history of transient ischemic attack (TIA), and cerebral infarction without residual deficits: Secondary | ICD-10-CM | POA: Diagnosis not present

## 2024-04-23 DIAGNOSIS — M4802 Spinal stenosis, cervical region: Secondary | ICD-10-CM | POA: Diagnosis not present

## 2024-04-23 DIAGNOSIS — R64 Cachexia: Secondary | ICD-10-CM | POA: Diagnosis not present

## 2024-04-23 DIAGNOSIS — E43 Unspecified severe protein-calorie malnutrition: Secondary | ICD-10-CM | POA: Diagnosis not present

## 2024-04-23 DIAGNOSIS — I69851 Hemiplegia and hemiparesis following other cerebrovascular disease affecting right dominant side: Secondary | ICD-10-CM | POA: Diagnosis not present

## 2024-04-23 DIAGNOSIS — M6284 Sarcopenia: Secondary | ICD-10-CM | POA: Diagnosis not present

## 2024-04-23 DIAGNOSIS — J449 Chronic obstructive pulmonary disease, unspecified: Secondary | ICD-10-CM | POA: Diagnosis not present

## 2024-04-23 DIAGNOSIS — R251 Tremor, unspecified: Secondary | ICD-10-CM | POA: Diagnosis not present

## 2024-04-23 DIAGNOSIS — D649 Anemia, unspecified: Secondary | ICD-10-CM | POA: Diagnosis not present

## 2024-04-23 DIAGNOSIS — G992 Myelopathy in diseases classified elsewhere: Secondary | ICD-10-CM | POA: Diagnosis not present

## 2024-04-23 DIAGNOSIS — M6281 Muscle weakness (generalized): Secondary | ICD-10-CM | POA: Diagnosis not present

## 2024-04-23 DIAGNOSIS — J45909 Unspecified asthma, uncomplicated: Secondary | ICD-10-CM | POA: Diagnosis not present

## 2024-04-24 DIAGNOSIS — M4802 Spinal stenosis, cervical region: Secondary | ICD-10-CM | POA: Diagnosis not present

## 2024-04-24 DIAGNOSIS — I69851 Hemiplegia and hemiparesis following other cerebrovascular disease affecting right dominant side: Secondary | ICD-10-CM | POA: Diagnosis not present

## 2024-04-24 DIAGNOSIS — I10 Essential (primary) hypertension: Secondary | ICD-10-CM | POA: Diagnosis not present

## 2024-04-24 DIAGNOSIS — I69920 Aphasia following unspecified cerebrovascular disease: Secondary | ICD-10-CM | POA: Diagnosis not present

## 2024-04-24 DIAGNOSIS — R64 Cachexia: Secondary | ICD-10-CM | POA: Diagnosis not present

## 2024-04-24 DIAGNOSIS — E43 Unspecified severe protein-calorie malnutrition: Secondary | ICD-10-CM | POA: Diagnosis not present

## 2024-04-24 DIAGNOSIS — Z8673 Personal history of transient ischemic attack (TIA), and cerebral infarction without residual deficits: Secondary | ICD-10-CM | POA: Diagnosis not present

## 2024-04-24 DIAGNOSIS — M6284 Sarcopenia: Secondary | ICD-10-CM | POA: Diagnosis not present

## 2024-04-24 DIAGNOSIS — G992 Myelopathy in diseases classified elsewhere: Secondary | ICD-10-CM | POA: Diagnosis not present

## 2024-04-24 DIAGNOSIS — J449 Chronic obstructive pulmonary disease, unspecified: Secondary | ICD-10-CM | POA: Diagnosis not present

## 2024-04-24 DIAGNOSIS — R279 Unspecified lack of coordination: Secondary | ICD-10-CM | POA: Diagnosis not present

## 2024-04-24 DIAGNOSIS — D649 Anemia, unspecified: Secondary | ICD-10-CM | POA: Diagnosis not present

## 2024-04-24 DIAGNOSIS — M6281 Muscle weakness (generalized): Secondary | ICD-10-CM | POA: Diagnosis not present

## 2024-04-24 DIAGNOSIS — R251 Tremor, unspecified: Secondary | ICD-10-CM | POA: Diagnosis not present

## 2024-04-24 DIAGNOSIS — J45909 Unspecified asthma, uncomplicated: Secondary | ICD-10-CM | POA: Diagnosis not present

## 2024-04-25 DIAGNOSIS — M6284 Sarcopenia: Secondary | ICD-10-CM | POA: Diagnosis not present

## 2024-04-25 DIAGNOSIS — R279 Unspecified lack of coordination: Secondary | ICD-10-CM | POA: Diagnosis not present

## 2024-04-25 DIAGNOSIS — I69851 Hemiplegia and hemiparesis following other cerebrovascular disease affecting right dominant side: Secondary | ICD-10-CM | POA: Diagnosis not present

## 2024-04-25 DIAGNOSIS — E43 Unspecified severe protein-calorie malnutrition: Secondary | ICD-10-CM | POA: Diagnosis not present

## 2024-04-25 DIAGNOSIS — G992 Myelopathy in diseases classified elsewhere: Secondary | ICD-10-CM | POA: Diagnosis not present

## 2024-04-25 DIAGNOSIS — J45909 Unspecified asthma, uncomplicated: Secondary | ICD-10-CM | POA: Diagnosis not present

## 2024-04-25 DIAGNOSIS — D649 Anemia, unspecified: Secondary | ICD-10-CM | POA: Diagnosis not present

## 2024-04-25 DIAGNOSIS — I10 Essential (primary) hypertension: Secondary | ICD-10-CM | POA: Diagnosis not present

## 2024-04-25 DIAGNOSIS — M6281 Muscle weakness (generalized): Secondary | ICD-10-CM | POA: Diagnosis not present

## 2024-04-25 DIAGNOSIS — M4802 Spinal stenosis, cervical region: Secondary | ICD-10-CM | POA: Diagnosis not present

## 2024-04-25 DIAGNOSIS — R251 Tremor, unspecified: Secondary | ICD-10-CM | POA: Diagnosis not present

## 2024-04-25 DIAGNOSIS — R64 Cachexia: Secondary | ICD-10-CM | POA: Diagnosis not present

## 2024-04-25 DIAGNOSIS — Z8673 Personal history of transient ischemic attack (TIA), and cerebral infarction without residual deficits: Secondary | ICD-10-CM | POA: Diagnosis not present

## 2024-04-25 DIAGNOSIS — I69920 Aphasia following unspecified cerebrovascular disease: Secondary | ICD-10-CM | POA: Diagnosis not present

## 2024-04-25 DIAGNOSIS — J449 Chronic obstructive pulmonary disease, unspecified: Secondary | ICD-10-CM | POA: Diagnosis not present

## 2024-04-28 DIAGNOSIS — I10 Essential (primary) hypertension: Secondary | ICD-10-CM | POA: Diagnosis not present

## 2024-04-28 DIAGNOSIS — Z8673 Personal history of transient ischemic attack (TIA), and cerebral infarction without residual deficits: Secondary | ICD-10-CM | POA: Diagnosis not present

## 2024-04-28 DIAGNOSIS — R64 Cachexia: Secondary | ICD-10-CM | POA: Diagnosis not present

## 2024-04-28 DIAGNOSIS — J449 Chronic obstructive pulmonary disease, unspecified: Secondary | ICD-10-CM | POA: Diagnosis not present

## 2024-04-28 DIAGNOSIS — I69920 Aphasia following unspecified cerebrovascular disease: Secondary | ICD-10-CM | POA: Diagnosis not present

## 2024-04-28 DIAGNOSIS — L89154 Pressure ulcer of sacral region, stage 4: Secondary | ICD-10-CM | POA: Diagnosis not present

## 2024-04-28 DIAGNOSIS — I69851 Hemiplegia and hemiparesis following other cerebrovascular disease affecting right dominant side: Secondary | ICD-10-CM | POA: Diagnosis not present

## 2024-04-28 DIAGNOSIS — M6284 Sarcopenia: Secondary | ICD-10-CM | POA: Diagnosis not present

## 2024-04-28 DIAGNOSIS — R251 Tremor, unspecified: Secondary | ICD-10-CM | POA: Diagnosis not present

## 2024-04-28 DIAGNOSIS — M6281 Muscle weakness (generalized): Secondary | ICD-10-CM | POA: Diagnosis not present

## 2024-04-28 DIAGNOSIS — J45909 Unspecified asthma, uncomplicated: Secondary | ICD-10-CM | POA: Diagnosis not present

## 2024-04-28 DIAGNOSIS — R279 Unspecified lack of coordination: Secondary | ICD-10-CM | POA: Diagnosis not present

## 2024-04-28 DIAGNOSIS — G992 Myelopathy in diseases classified elsewhere: Secondary | ICD-10-CM | POA: Diagnosis not present

## 2024-04-28 DIAGNOSIS — D649 Anemia, unspecified: Secondary | ICD-10-CM | POA: Diagnosis not present

## 2024-04-28 DIAGNOSIS — E43 Unspecified severe protein-calorie malnutrition: Secondary | ICD-10-CM | POA: Diagnosis not present

## 2024-04-28 DIAGNOSIS — M4802 Spinal stenosis, cervical region: Secondary | ICD-10-CM | POA: Diagnosis not present

## 2024-04-29 DIAGNOSIS — R64 Cachexia: Secondary | ICD-10-CM | POA: Diagnosis not present

## 2024-04-29 DIAGNOSIS — M6284 Sarcopenia: Secondary | ICD-10-CM | POA: Diagnosis not present

## 2024-04-29 DIAGNOSIS — R279 Unspecified lack of coordination: Secondary | ICD-10-CM | POA: Diagnosis not present

## 2024-04-29 DIAGNOSIS — D649 Anemia, unspecified: Secondary | ICD-10-CM | POA: Diagnosis not present

## 2024-04-29 DIAGNOSIS — I69920 Aphasia following unspecified cerebrovascular disease: Secondary | ICD-10-CM | POA: Diagnosis not present

## 2024-04-29 DIAGNOSIS — Z8673 Personal history of transient ischemic attack (TIA), and cerebral infarction without residual deficits: Secondary | ICD-10-CM | POA: Diagnosis not present

## 2024-04-29 DIAGNOSIS — J45909 Unspecified asthma, uncomplicated: Secondary | ICD-10-CM | POA: Diagnosis not present

## 2024-04-29 DIAGNOSIS — M6281 Muscle weakness (generalized): Secondary | ICD-10-CM | POA: Diagnosis not present

## 2024-04-29 DIAGNOSIS — I69851 Hemiplegia and hemiparesis following other cerebrovascular disease affecting right dominant side: Secondary | ICD-10-CM | POA: Diagnosis not present

## 2024-04-29 DIAGNOSIS — M4802 Spinal stenosis, cervical region: Secondary | ICD-10-CM | POA: Diagnosis not present

## 2024-04-29 DIAGNOSIS — I10 Essential (primary) hypertension: Secondary | ICD-10-CM | POA: Diagnosis not present

## 2024-04-29 DIAGNOSIS — J449 Chronic obstructive pulmonary disease, unspecified: Secondary | ICD-10-CM | POA: Diagnosis not present

## 2024-04-29 DIAGNOSIS — E43 Unspecified severe protein-calorie malnutrition: Secondary | ICD-10-CM | POA: Diagnosis not present

## 2024-04-29 DIAGNOSIS — R251 Tremor, unspecified: Secondary | ICD-10-CM | POA: Diagnosis not present

## 2024-04-29 DIAGNOSIS — G992 Myelopathy in diseases classified elsewhere: Secondary | ICD-10-CM | POA: Diagnosis not present

## 2024-04-30 DIAGNOSIS — I10 Essential (primary) hypertension: Secondary | ICD-10-CM | POA: Diagnosis not present

## 2024-04-30 DIAGNOSIS — I69920 Aphasia following unspecified cerebrovascular disease: Secondary | ICD-10-CM | POA: Diagnosis not present

## 2024-04-30 DIAGNOSIS — M6284 Sarcopenia: Secondary | ICD-10-CM | POA: Diagnosis not present

## 2024-04-30 DIAGNOSIS — Z8673 Personal history of transient ischemic attack (TIA), and cerebral infarction without residual deficits: Secondary | ICD-10-CM | POA: Diagnosis not present

## 2024-04-30 DIAGNOSIS — R251 Tremor, unspecified: Secondary | ICD-10-CM | POA: Diagnosis not present

## 2024-04-30 DIAGNOSIS — G992 Myelopathy in diseases classified elsewhere: Secondary | ICD-10-CM | POA: Diagnosis not present

## 2024-04-30 DIAGNOSIS — I69851 Hemiplegia and hemiparesis following other cerebrovascular disease affecting right dominant side: Secondary | ICD-10-CM | POA: Diagnosis not present

## 2024-04-30 DIAGNOSIS — M6281 Muscle weakness (generalized): Secondary | ICD-10-CM | POA: Diagnosis not present

## 2024-04-30 DIAGNOSIS — M4802 Spinal stenosis, cervical region: Secondary | ICD-10-CM | POA: Diagnosis not present

## 2024-04-30 DIAGNOSIS — E43 Unspecified severe protein-calorie malnutrition: Secondary | ICD-10-CM | POA: Diagnosis not present

## 2024-04-30 DIAGNOSIS — R279 Unspecified lack of coordination: Secondary | ICD-10-CM | POA: Diagnosis not present

## 2024-04-30 DIAGNOSIS — D649 Anemia, unspecified: Secondary | ICD-10-CM | POA: Diagnosis not present

## 2024-04-30 DIAGNOSIS — J45909 Unspecified asthma, uncomplicated: Secondary | ICD-10-CM | POA: Diagnosis not present

## 2024-04-30 DIAGNOSIS — J449 Chronic obstructive pulmonary disease, unspecified: Secondary | ICD-10-CM | POA: Diagnosis not present

## 2024-04-30 DIAGNOSIS — R64 Cachexia: Secondary | ICD-10-CM | POA: Diagnosis not present

## 2024-05-01 DIAGNOSIS — Z8673 Personal history of transient ischemic attack (TIA), and cerebral infarction without residual deficits: Secondary | ICD-10-CM | POA: Diagnosis not present

## 2024-05-01 DIAGNOSIS — E43 Unspecified severe protein-calorie malnutrition: Secondary | ICD-10-CM | POA: Diagnosis not present

## 2024-05-01 DIAGNOSIS — I69851 Hemiplegia and hemiparesis following other cerebrovascular disease affecting right dominant side: Secondary | ICD-10-CM | POA: Diagnosis not present

## 2024-05-01 DIAGNOSIS — D649 Anemia, unspecified: Secondary | ICD-10-CM | POA: Diagnosis not present

## 2024-05-01 DIAGNOSIS — M4802 Spinal stenosis, cervical region: Secondary | ICD-10-CM | POA: Diagnosis not present

## 2024-05-01 DIAGNOSIS — J449 Chronic obstructive pulmonary disease, unspecified: Secondary | ICD-10-CM | POA: Diagnosis not present

## 2024-05-01 DIAGNOSIS — M6284 Sarcopenia: Secondary | ICD-10-CM | POA: Diagnosis not present

## 2024-05-01 DIAGNOSIS — R251 Tremor, unspecified: Secondary | ICD-10-CM | POA: Diagnosis not present

## 2024-05-01 DIAGNOSIS — G992 Myelopathy in diseases classified elsewhere: Secondary | ICD-10-CM | POA: Diagnosis not present

## 2024-05-01 DIAGNOSIS — R64 Cachexia: Secondary | ICD-10-CM | POA: Diagnosis not present

## 2024-05-01 DIAGNOSIS — M6281 Muscle weakness (generalized): Secondary | ICD-10-CM | POA: Diagnosis not present

## 2024-05-01 DIAGNOSIS — R279 Unspecified lack of coordination: Secondary | ICD-10-CM | POA: Diagnosis not present

## 2024-05-01 DIAGNOSIS — J45909 Unspecified asthma, uncomplicated: Secondary | ICD-10-CM | POA: Diagnosis not present

## 2024-05-01 DIAGNOSIS — I69920 Aphasia following unspecified cerebrovascular disease: Secondary | ICD-10-CM | POA: Diagnosis not present

## 2024-05-01 DIAGNOSIS — I10 Essential (primary) hypertension: Secondary | ICD-10-CM | POA: Diagnosis not present

## 2024-05-02 DIAGNOSIS — R64 Cachexia: Secondary | ICD-10-CM | POA: Diagnosis not present

## 2024-05-02 DIAGNOSIS — Z8673 Personal history of transient ischemic attack (TIA), and cerebral infarction without residual deficits: Secondary | ICD-10-CM | POA: Diagnosis not present

## 2024-05-02 DIAGNOSIS — M6281 Muscle weakness (generalized): Secondary | ICD-10-CM | POA: Diagnosis not present

## 2024-05-02 DIAGNOSIS — I69920 Aphasia following unspecified cerebrovascular disease: Secondary | ICD-10-CM | POA: Diagnosis not present

## 2024-05-02 DIAGNOSIS — E43 Unspecified severe protein-calorie malnutrition: Secondary | ICD-10-CM | POA: Diagnosis not present

## 2024-05-02 DIAGNOSIS — I69851 Hemiplegia and hemiparesis following other cerebrovascular disease affecting right dominant side: Secondary | ICD-10-CM | POA: Diagnosis not present

## 2024-05-02 DIAGNOSIS — I10 Essential (primary) hypertension: Secondary | ICD-10-CM | POA: Diagnosis not present

## 2024-05-02 DIAGNOSIS — R1310 Dysphagia, unspecified: Secondary | ICD-10-CM | POA: Diagnosis not present

## 2024-05-02 DIAGNOSIS — M4802 Spinal stenosis, cervical region: Secondary | ICD-10-CM | POA: Diagnosis not present

## 2024-05-02 DIAGNOSIS — M6284 Sarcopenia: Secondary | ICD-10-CM | POA: Diagnosis not present

## 2024-05-02 DIAGNOSIS — J449 Chronic obstructive pulmonary disease, unspecified: Secondary | ICD-10-CM | POA: Diagnosis not present

## 2024-05-02 DIAGNOSIS — R109 Unspecified abdominal pain: Secondary | ICD-10-CM | POA: Diagnosis not present

## 2024-05-02 DIAGNOSIS — D649 Anemia, unspecified: Secondary | ICD-10-CM | POA: Diagnosis not present

## 2024-05-02 DIAGNOSIS — R279 Unspecified lack of coordination: Secondary | ICD-10-CM | POA: Diagnosis not present

## 2024-05-02 DIAGNOSIS — J45909 Unspecified asthma, uncomplicated: Secondary | ICD-10-CM | POA: Diagnosis not present

## 2024-05-02 DIAGNOSIS — Z931 Gastrostomy status: Secondary | ICD-10-CM | POA: Diagnosis not present

## 2024-05-02 DIAGNOSIS — G992 Myelopathy in diseases classified elsewhere: Secondary | ICD-10-CM | POA: Diagnosis not present

## 2024-05-02 DIAGNOSIS — R251 Tremor, unspecified: Secondary | ICD-10-CM | POA: Diagnosis not present

## 2024-05-04 DIAGNOSIS — R251 Tremor, unspecified: Secondary | ICD-10-CM | POA: Diagnosis not present

## 2024-05-04 DIAGNOSIS — E43 Unspecified severe protein-calorie malnutrition: Secondary | ICD-10-CM | POA: Diagnosis not present

## 2024-05-04 DIAGNOSIS — Z8673 Personal history of transient ischemic attack (TIA), and cerebral infarction without residual deficits: Secondary | ICD-10-CM | POA: Diagnosis not present

## 2024-05-04 DIAGNOSIS — I69851 Hemiplegia and hemiparesis following other cerebrovascular disease affecting right dominant side: Secondary | ICD-10-CM | POA: Diagnosis not present

## 2024-05-04 DIAGNOSIS — M4802 Spinal stenosis, cervical region: Secondary | ICD-10-CM | POA: Diagnosis not present

## 2024-05-04 DIAGNOSIS — R64 Cachexia: Secondary | ICD-10-CM | POA: Diagnosis not present

## 2024-05-04 DIAGNOSIS — I10 Essential (primary) hypertension: Secondary | ICD-10-CM | POA: Diagnosis not present

## 2024-05-04 DIAGNOSIS — J449 Chronic obstructive pulmonary disease, unspecified: Secondary | ICD-10-CM | POA: Diagnosis not present

## 2024-05-04 DIAGNOSIS — G992 Myelopathy in diseases classified elsewhere: Secondary | ICD-10-CM | POA: Diagnosis not present

## 2024-05-04 DIAGNOSIS — R279 Unspecified lack of coordination: Secondary | ICD-10-CM | POA: Diagnosis not present

## 2024-05-04 DIAGNOSIS — J45909 Unspecified asthma, uncomplicated: Secondary | ICD-10-CM | POA: Diagnosis not present

## 2024-05-04 DIAGNOSIS — M6281 Muscle weakness (generalized): Secondary | ICD-10-CM | POA: Diagnosis not present

## 2024-05-04 DIAGNOSIS — I69920 Aphasia following unspecified cerebrovascular disease: Secondary | ICD-10-CM | POA: Diagnosis not present

## 2024-05-04 DIAGNOSIS — M6284 Sarcopenia: Secondary | ICD-10-CM | POA: Diagnosis not present

## 2024-05-04 DIAGNOSIS — D649 Anemia, unspecified: Secondary | ICD-10-CM | POA: Diagnosis not present

## 2024-05-05 DIAGNOSIS — R279 Unspecified lack of coordination: Secondary | ICD-10-CM | POA: Diagnosis not present

## 2024-05-05 DIAGNOSIS — I69920 Aphasia following unspecified cerebrovascular disease: Secondary | ICD-10-CM | POA: Diagnosis not present

## 2024-05-05 DIAGNOSIS — R109 Unspecified abdominal pain: Secondary | ICD-10-CM | POA: Diagnosis not present

## 2024-05-05 DIAGNOSIS — Z8673 Personal history of transient ischemic attack (TIA), and cerebral infarction without residual deficits: Secondary | ICD-10-CM | POA: Diagnosis not present

## 2024-05-05 DIAGNOSIS — R251 Tremor, unspecified: Secondary | ICD-10-CM | POA: Diagnosis not present

## 2024-05-05 DIAGNOSIS — L89154 Pressure ulcer of sacral region, stage 4: Secondary | ICD-10-CM | POA: Diagnosis not present

## 2024-05-05 DIAGNOSIS — Z931 Gastrostomy status: Secondary | ICD-10-CM | POA: Diagnosis not present

## 2024-05-05 DIAGNOSIS — G992 Myelopathy in diseases classified elsewhere: Secondary | ICD-10-CM | POA: Diagnosis not present

## 2024-05-05 DIAGNOSIS — M6281 Muscle weakness (generalized): Secondary | ICD-10-CM | POA: Diagnosis not present

## 2024-05-05 DIAGNOSIS — J45909 Unspecified asthma, uncomplicated: Secondary | ICD-10-CM | POA: Diagnosis not present

## 2024-05-05 DIAGNOSIS — I69851 Hemiplegia and hemiparesis following other cerebrovascular disease affecting right dominant side: Secondary | ICD-10-CM | POA: Diagnosis not present

## 2024-05-05 DIAGNOSIS — I10 Essential (primary) hypertension: Secondary | ICD-10-CM | POA: Diagnosis not present

## 2024-05-05 DIAGNOSIS — R64 Cachexia: Secondary | ICD-10-CM | POA: Diagnosis not present

## 2024-05-05 DIAGNOSIS — M6284 Sarcopenia: Secondary | ICD-10-CM | POA: Diagnosis not present

## 2024-05-05 DIAGNOSIS — E43 Unspecified severe protein-calorie malnutrition: Secondary | ICD-10-CM | POA: Diagnosis not present

## 2024-05-05 DIAGNOSIS — J449 Chronic obstructive pulmonary disease, unspecified: Secondary | ICD-10-CM | POA: Diagnosis not present

## 2024-05-05 DIAGNOSIS — F17200 Nicotine dependence, unspecified, uncomplicated: Secondary | ICD-10-CM | POA: Diagnosis not present

## 2024-05-05 DIAGNOSIS — M4802 Spinal stenosis, cervical region: Secondary | ICD-10-CM | POA: Diagnosis not present

## 2024-05-05 DIAGNOSIS — D649 Anemia, unspecified: Secondary | ICD-10-CM | POA: Diagnosis not present

## 2024-05-06 DIAGNOSIS — R279 Unspecified lack of coordination: Secondary | ICD-10-CM | POA: Diagnosis not present

## 2024-05-06 DIAGNOSIS — I69851 Hemiplegia and hemiparesis following other cerebrovascular disease affecting right dominant side: Secondary | ICD-10-CM | POA: Diagnosis not present

## 2024-05-06 DIAGNOSIS — I69920 Aphasia following unspecified cerebrovascular disease: Secondary | ICD-10-CM | POA: Diagnosis not present

## 2024-05-06 DIAGNOSIS — R251 Tremor, unspecified: Secondary | ICD-10-CM | POA: Diagnosis not present

## 2024-05-06 DIAGNOSIS — D649 Anemia, unspecified: Secondary | ICD-10-CM | POA: Diagnosis not present

## 2024-05-06 DIAGNOSIS — R64 Cachexia: Secondary | ICD-10-CM | POA: Diagnosis not present

## 2024-05-06 DIAGNOSIS — B351 Tinea unguium: Secondary | ICD-10-CM | POA: Diagnosis not present

## 2024-05-06 DIAGNOSIS — J45909 Unspecified asthma, uncomplicated: Secondary | ICD-10-CM | POA: Diagnosis not present

## 2024-05-06 DIAGNOSIS — J449 Chronic obstructive pulmonary disease, unspecified: Secondary | ICD-10-CM | POA: Diagnosis not present

## 2024-05-06 DIAGNOSIS — M6281 Muscle weakness (generalized): Secondary | ICD-10-CM | POA: Diagnosis not present

## 2024-05-06 DIAGNOSIS — G992 Myelopathy in diseases classified elsewhere: Secondary | ICD-10-CM | POA: Diagnosis not present

## 2024-05-06 DIAGNOSIS — Z8673 Personal history of transient ischemic attack (TIA), and cerebral infarction without residual deficits: Secondary | ICD-10-CM | POA: Diagnosis not present

## 2024-05-06 DIAGNOSIS — I10 Essential (primary) hypertension: Secondary | ICD-10-CM | POA: Diagnosis not present

## 2024-05-06 DIAGNOSIS — M6284 Sarcopenia: Secondary | ICD-10-CM | POA: Diagnosis not present

## 2024-05-06 DIAGNOSIS — M4802 Spinal stenosis, cervical region: Secondary | ICD-10-CM | POA: Diagnosis not present

## 2024-05-06 DIAGNOSIS — E43 Unspecified severe protein-calorie malnutrition: Secondary | ICD-10-CM | POA: Diagnosis not present

## 2024-05-06 DIAGNOSIS — M79675 Pain in left toe(s): Secondary | ICD-10-CM | POA: Diagnosis not present

## 2024-05-06 DIAGNOSIS — M79674 Pain in right toe(s): Secondary | ICD-10-CM | POA: Diagnosis not present

## 2024-05-07 DIAGNOSIS — M4802 Spinal stenosis, cervical region: Secondary | ICD-10-CM | POA: Diagnosis not present

## 2024-05-07 DIAGNOSIS — I69920 Aphasia following unspecified cerebrovascular disease: Secondary | ICD-10-CM | POA: Diagnosis not present

## 2024-05-07 DIAGNOSIS — Z8673 Personal history of transient ischemic attack (TIA), and cerebral infarction without residual deficits: Secondary | ICD-10-CM | POA: Diagnosis not present

## 2024-05-07 DIAGNOSIS — M6281 Muscle weakness (generalized): Secondary | ICD-10-CM | POA: Diagnosis not present

## 2024-05-07 DIAGNOSIS — R251 Tremor, unspecified: Secondary | ICD-10-CM | POA: Diagnosis not present

## 2024-05-07 DIAGNOSIS — J449 Chronic obstructive pulmonary disease, unspecified: Secondary | ICD-10-CM | POA: Diagnosis not present

## 2024-05-07 DIAGNOSIS — D649 Anemia, unspecified: Secondary | ICD-10-CM | POA: Diagnosis not present

## 2024-05-07 DIAGNOSIS — J45909 Unspecified asthma, uncomplicated: Secondary | ICD-10-CM | POA: Diagnosis not present

## 2024-05-07 DIAGNOSIS — I10 Essential (primary) hypertension: Secondary | ICD-10-CM | POA: Diagnosis not present

## 2024-05-07 DIAGNOSIS — R52 Pain, unspecified: Secondary | ICD-10-CM | POA: Diagnosis not present

## 2024-05-07 DIAGNOSIS — R279 Unspecified lack of coordination: Secondary | ICD-10-CM | POA: Diagnosis not present

## 2024-05-07 DIAGNOSIS — R64 Cachexia: Secondary | ICD-10-CM | POA: Diagnosis not present

## 2024-05-07 DIAGNOSIS — I69851 Hemiplegia and hemiparesis following other cerebrovascular disease affecting right dominant side: Secondary | ICD-10-CM | POA: Diagnosis not present

## 2024-05-07 DIAGNOSIS — G992 Myelopathy in diseases classified elsewhere: Secondary | ICD-10-CM | POA: Diagnosis not present

## 2024-05-07 DIAGNOSIS — Z5181 Encounter for therapeutic drug level monitoring: Secondary | ICD-10-CM | POA: Diagnosis not present

## 2024-05-07 DIAGNOSIS — E43 Unspecified severe protein-calorie malnutrition: Secondary | ICD-10-CM | POA: Diagnosis not present

## 2024-05-07 DIAGNOSIS — M6284 Sarcopenia: Secondary | ICD-10-CM | POA: Diagnosis not present

## 2024-05-09 DIAGNOSIS — M6284 Sarcopenia: Secondary | ICD-10-CM | POA: Diagnosis not present

## 2024-05-09 DIAGNOSIS — R279 Unspecified lack of coordination: Secondary | ICD-10-CM | POA: Diagnosis not present

## 2024-05-09 DIAGNOSIS — E43 Unspecified severe protein-calorie malnutrition: Secondary | ICD-10-CM | POA: Diagnosis not present

## 2024-05-09 DIAGNOSIS — R64 Cachexia: Secondary | ICD-10-CM | POA: Diagnosis not present

## 2024-05-09 DIAGNOSIS — I69920 Aphasia following unspecified cerebrovascular disease: Secondary | ICD-10-CM | POA: Diagnosis not present

## 2024-05-09 DIAGNOSIS — M6281 Muscle weakness (generalized): Secondary | ICD-10-CM | POA: Diagnosis not present

## 2024-05-09 DIAGNOSIS — M4802 Spinal stenosis, cervical region: Secondary | ICD-10-CM | POA: Diagnosis not present

## 2024-05-09 DIAGNOSIS — R251 Tremor, unspecified: Secondary | ICD-10-CM | POA: Diagnosis not present

## 2024-05-09 DIAGNOSIS — Z8673 Personal history of transient ischemic attack (TIA), and cerebral infarction without residual deficits: Secondary | ICD-10-CM | POA: Diagnosis not present

## 2024-05-09 DIAGNOSIS — I10 Essential (primary) hypertension: Secondary | ICD-10-CM | POA: Diagnosis not present

## 2024-05-09 DIAGNOSIS — J45909 Unspecified asthma, uncomplicated: Secondary | ICD-10-CM | POA: Diagnosis not present

## 2024-05-09 DIAGNOSIS — J449 Chronic obstructive pulmonary disease, unspecified: Secondary | ICD-10-CM | POA: Diagnosis not present

## 2024-05-09 DIAGNOSIS — D649 Anemia, unspecified: Secondary | ICD-10-CM | POA: Diagnosis not present

## 2024-05-09 DIAGNOSIS — G992 Myelopathy in diseases classified elsewhere: Secondary | ICD-10-CM | POA: Diagnosis not present

## 2024-05-09 DIAGNOSIS — I69851 Hemiplegia and hemiparesis following other cerebrovascular disease affecting right dominant side: Secondary | ICD-10-CM | POA: Diagnosis not present

## 2024-05-09 DIAGNOSIS — E119 Type 2 diabetes mellitus without complications: Secondary | ICD-10-CM | POA: Diagnosis not present

## 2024-05-10 DIAGNOSIS — Z743 Need for continuous supervision: Secondary | ICD-10-CM | POA: Diagnosis not present

## 2024-05-10 DIAGNOSIS — N3000 Acute cystitis without hematuria: Secondary | ICD-10-CM | POA: Diagnosis not present

## 2024-05-10 DIAGNOSIS — Z7401 Bed confinement status: Secondary | ICD-10-CM | POA: Diagnosis not present

## 2024-05-10 DIAGNOSIS — Z981 Arthrodesis status: Secondary | ICD-10-CM | POA: Diagnosis not present

## 2024-05-10 DIAGNOSIS — W19XXXA Unspecified fall, initial encounter: Secondary | ICD-10-CM | POA: Diagnosis not present

## 2024-05-10 DIAGNOSIS — J439 Emphysema, unspecified: Secondary | ICD-10-CM | POA: Diagnosis not present

## 2024-05-10 DIAGNOSIS — R531 Weakness: Secondary | ICD-10-CM | POA: Diagnosis not present

## 2024-05-10 DIAGNOSIS — R1084 Generalized abdominal pain: Secondary | ICD-10-CM | POA: Diagnosis not present

## 2024-05-10 DIAGNOSIS — R410 Disorientation, unspecified: Secondary | ICD-10-CM | POA: Diagnosis not present

## 2024-05-10 DIAGNOSIS — N3289 Other specified disorders of bladder: Secondary | ICD-10-CM | POA: Diagnosis not present

## 2024-05-10 DIAGNOSIS — Z043 Encounter for examination and observation following other accident: Secondary | ICD-10-CM | POA: Diagnosis not present

## 2024-05-10 DIAGNOSIS — S0990XA Unspecified injury of head, initial encounter: Secondary | ICD-10-CM | POA: Diagnosis not present

## 2024-05-10 DIAGNOSIS — I6782 Cerebral ischemia: Secondary | ICD-10-CM | POA: Diagnosis not present

## 2024-05-10 DIAGNOSIS — S199XXA Unspecified injury of neck, initial encounter: Secondary | ICD-10-CM | POA: Diagnosis not present

## 2024-05-12 DIAGNOSIS — I69851 Hemiplegia and hemiparesis following other cerebrovascular disease affecting right dominant side: Secondary | ICD-10-CM | POA: Diagnosis not present

## 2024-05-12 DIAGNOSIS — I69920 Aphasia following unspecified cerebrovascular disease: Secondary | ICD-10-CM | POA: Diagnosis not present

## 2024-05-12 DIAGNOSIS — J45909 Unspecified asthma, uncomplicated: Secondary | ICD-10-CM | POA: Diagnosis not present

## 2024-05-12 DIAGNOSIS — R279 Unspecified lack of coordination: Secondary | ICD-10-CM | POA: Diagnosis not present

## 2024-05-12 DIAGNOSIS — W19XXXA Unspecified fall, initial encounter: Secondary | ICD-10-CM | POA: Diagnosis not present

## 2024-05-12 DIAGNOSIS — E43 Unspecified severe protein-calorie malnutrition: Secondary | ICD-10-CM | POA: Diagnosis not present

## 2024-05-12 DIAGNOSIS — G992 Myelopathy in diseases classified elsewhere: Secondary | ICD-10-CM | POA: Diagnosis not present

## 2024-05-12 DIAGNOSIS — M6281 Muscle weakness (generalized): Secondary | ICD-10-CM | POA: Diagnosis not present

## 2024-05-12 DIAGNOSIS — S0083XA Contusion of other part of head, initial encounter: Secondary | ICD-10-CM | POA: Diagnosis not present

## 2024-05-12 DIAGNOSIS — Z043 Encounter for examination and observation following other accident: Secondary | ICD-10-CM | POA: Diagnosis not present

## 2024-05-12 DIAGNOSIS — J449 Chronic obstructive pulmonary disease, unspecified: Secondary | ICD-10-CM | POA: Diagnosis not present

## 2024-05-12 DIAGNOSIS — D649 Anemia, unspecified: Secondary | ICD-10-CM | POA: Diagnosis not present

## 2024-05-12 DIAGNOSIS — Z8673 Personal history of transient ischemic attack (TIA), and cerebral infarction without residual deficits: Secondary | ICD-10-CM | POA: Diagnosis not present

## 2024-05-12 DIAGNOSIS — M4802 Spinal stenosis, cervical region: Secondary | ICD-10-CM | POA: Diagnosis not present

## 2024-05-12 DIAGNOSIS — M6284 Sarcopenia: Secondary | ICD-10-CM | POA: Diagnosis not present

## 2024-05-12 DIAGNOSIS — I10 Essential (primary) hypertension: Secondary | ICD-10-CM | POA: Diagnosis not present

## 2024-05-12 DIAGNOSIS — R64 Cachexia: Secondary | ICD-10-CM | POA: Diagnosis not present

## 2024-05-12 DIAGNOSIS — R251 Tremor, unspecified: Secondary | ICD-10-CM | POA: Diagnosis not present

## 2024-05-13 DIAGNOSIS — M6281 Muscle weakness (generalized): Secondary | ICD-10-CM | POA: Diagnosis not present

## 2024-05-13 DIAGNOSIS — I69920 Aphasia following unspecified cerebrovascular disease: Secondary | ICD-10-CM | POA: Diagnosis not present

## 2024-05-13 DIAGNOSIS — J449 Chronic obstructive pulmonary disease, unspecified: Secondary | ICD-10-CM | POA: Diagnosis not present

## 2024-05-13 DIAGNOSIS — I10 Essential (primary) hypertension: Secondary | ICD-10-CM | POA: Diagnosis not present

## 2024-05-13 DIAGNOSIS — R251 Tremor, unspecified: Secondary | ICD-10-CM | POA: Diagnosis not present

## 2024-05-13 DIAGNOSIS — D649 Anemia, unspecified: Secondary | ICD-10-CM | POA: Diagnosis not present

## 2024-05-13 DIAGNOSIS — G992 Myelopathy in diseases classified elsewhere: Secondary | ICD-10-CM | POA: Diagnosis not present

## 2024-05-13 DIAGNOSIS — R64 Cachexia: Secondary | ICD-10-CM | POA: Diagnosis not present

## 2024-05-13 DIAGNOSIS — J45909 Unspecified asthma, uncomplicated: Secondary | ICD-10-CM | POA: Diagnosis not present

## 2024-05-13 DIAGNOSIS — M4802 Spinal stenosis, cervical region: Secondary | ICD-10-CM | POA: Diagnosis not present

## 2024-05-13 DIAGNOSIS — R279 Unspecified lack of coordination: Secondary | ICD-10-CM | POA: Diagnosis not present

## 2024-05-13 DIAGNOSIS — I69851 Hemiplegia and hemiparesis following other cerebrovascular disease affecting right dominant side: Secondary | ICD-10-CM | POA: Diagnosis not present

## 2024-05-13 DIAGNOSIS — E43 Unspecified severe protein-calorie malnutrition: Secondary | ICD-10-CM | POA: Diagnosis not present

## 2024-05-13 DIAGNOSIS — Z8673 Personal history of transient ischemic attack (TIA), and cerebral infarction without residual deficits: Secondary | ICD-10-CM | POA: Diagnosis not present

## 2024-05-13 DIAGNOSIS — M6284 Sarcopenia: Secondary | ICD-10-CM | POA: Diagnosis not present

## 2024-05-14 DIAGNOSIS — Z8673 Personal history of transient ischemic attack (TIA), and cerebral infarction without residual deficits: Secondary | ICD-10-CM | POA: Diagnosis not present

## 2024-05-14 DIAGNOSIS — I10 Essential (primary) hypertension: Secondary | ICD-10-CM | POA: Diagnosis not present

## 2024-05-14 DIAGNOSIS — M4802 Spinal stenosis, cervical region: Secondary | ICD-10-CM | POA: Diagnosis not present

## 2024-05-14 DIAGNOSIS — I69920 Aphasia following unspecified cerebrovascular disease: Secondary | ICD-10-CM | POA: Diagnosis not present

## 2024-05-14 DIAGNOSIS — G992 Myelopathy in diseases classified elsewhere: Secondary | ICD-10-CM | POA: Diagnosis not present

## 2024-05-14 DIAGNOSIS — D649 Anemia, unspecified: Secondary | ICD-10-CM | POA: Diagnosis not present

## 2024-05-14 DIAGNOSIS — J449 Chronic obstructive pulmonary disease, unspecified: Secondary | ICD-10-CM | POA: Diagnosis not present

## 2024-05-14 DIAGNOSIS — J45909 Unspecified asthma, uncomplicated: Secondary | ICD-10-CM | POA: Diagnosis not present

## 2024-05-14 DIAGNOSIS — E43 Unspecified severe protein-calorie malnutrition: Secondary | ICD-10-CM | POA: Diagnosis not present

## 2024-05-14 DIAGNOSIS — R64 Cachexia: Secondary | ICD-10-CM | POA: Diagnosis not present

## 2024-05-14 DIAGNOSIS — M6284 Sarcopenia: Secondary | ICD-10-CM | POA: Diagnosis not present

## 2024-05-14 DIAGNOSIS — R251 Tremor, unspecified: Secondary | ICD-10-CM | POA: Diagnosis not present

## 2024-05-14 DIAGNOSIS — I69851 Hemiplegia and hemiparesis following other cerebrovascular disease affecting right dominant side: Secondary | ICD-10-CM | POA: Diagnosis not present

## 2024-05-14 DIAGNOSIS — R279 Unspecified lack of coordination: Secondary | ICD-10-CM | POA: Diagnosis not present

## 2024-05-14 DIAGNOSIS — M6281 Muscle weakness (generalized): Secondary | ICD-10-CM | POA: Diagnosis not present

## 2024-05-15 DIAGNOSIS — E43 Unspecified severe protein-calorie malnutrition: Secondary | ICD-10-CM | POA: Diagnosis not present

## 2024-05-15 DIAGNOSIS — J449 Chronic obstructive pulmonary disease, unspecified: Secondary | ICD-10-CM | POA: Diagnosis not present

## 2024-05-15 DIAGNOSIS — I69920 Aphasia following unspecified cerebrovascular disease: Secondary | ICD-10-CM | POA: Diagnosis not present

## 2024-05-15 DIAGNOSIS — D649 Anemia, unspecified: Secondary | ICD-10-CM | POA: Diagnosis not present

## 2024-05-15 DIAGNOSIS — M4802 Spinal stenosis, cervical region: Secondary | ICD-10-CM | POA: Diagnosis not present

## 2024-05-15 DIAGNOSIS — R64 Cachexia: Secondary | ICD-10-CM | POA: Diagnosis not present

## 2024-05-15 DIAGNOSIS — I69851 Hemiplegia and hemiparesis following other cerebrovascular disease affecting right dominant side: Secondary | ICD-10-CM | POA: Diagnosis not present

## 2024-05-15 DIAGNOSIS — J45909 Unspecified asthma, uncomplicated: Secondary | ICD-10-CM | POA: Diagnosis not present

## 2024-05-15 DIAGNOSIS — M6284 Sarcopenia: Secondary | ICD-10-CM | POA: Diagnosis not present

## 2024-05-15 DIAGNOSIS — I10 Essential (primary) hypertension: Secondary | ICD-10-CM | POA: Diagnosis not present

## 2024-05-15 DIAGNOSIS — R279 Unspecified lack of coordination: Secondary | ICD-10-CM | POA: Diagnosis not present

## 2024-05-15 DIAGNOSIS — Z8673 Personal history of transient ischemic attack (TIA), and cerebral infarction without residual deficits: Secondary | ICD-10-CM | POA: Diagnosis not present

## 2024-05-15 DIAGNOSIS — M6281 Muscle weakness (generalized): Secondary | ICD-10-CM | POA: Diagnosis not present

## 2024-05-15 DIAGNOSIS — G992 Myelopathy in diseases classified elsewhere: Secondary | ICD-10-CM | POA: Diagnosis not present

## 2024-05-15 DIAGNOSIS — R251 Tremor, unspecified: Secondary | ICD-10-CM | POA: Diagnosis not present

## 2024-05-16 DIAGNOSIS — D649 Anemia, unspecified: Secondary | ICD-10-CM | POA: Diagnosis not present

## 2024-05-16 DIAGNOSIS — R251 Tremor, unspecified: Secondary | ICD-10-CM | POA: Diagnosis not present

## 2024-05-16 DIAGNOSIS — M6284 Sarcopenia: Secondary | ICD-10-CM | POA: Diagnosis not present

## 2024-05-16 DIAGNOSIS — I69920 Aphasia following unspecified cerebrovascular disease: Secondary | ICD-10-CM | POA: Diagnosis not present

## 2024-05-16 DIAGNOSIS — I69851 Hemiplegia and hemiparesis following other cerebrovascular disease affecting right dominant side: Secondary | ICD-10-CM | POA: Diagnosis not present

## 2024-05-16 DIAGNOSIS — M6281 Muscle weakness (generalized): Secondary | ICD-10-CM | POA: Diagnosis not present

## 2024-05-16 DIAGNOSIS — M4802 Spinal stenosis, cervical region: Secondary | ICD-10-CM | POA: Diagnosis not present

## 2024-05-16 DIAGNOSIS — R279 Unspecified lack of coordination: Secondary | ICD-10-CM | POA: Diagnosis not present

## 2024-05-16 DIAGNOSIS — I10 Essential (primary) hypertension: Secondary | ICD-10-CM | POA: Diagnosis not present

## 2024-05-16 DIAGNOSIS — J449 Chronic obstructive pulmonary disease, unspecified: Secondary | ICD-10-CM | POA: Diagnosis not present

## 2024-05-16 DIAGNOSIS — R64 Cachexia: Secondary | ICD-10-CM | POA: Diagnosis not present

## 2024-05-16 DIAGNOSIS — Z8673 Personal history of transient ischemic attack (TIA), and cerebral infarction without residual deficits: Secondary | ICD-10-CM | POA: Diagnosis not present

## 2024-05-16 DIAGNOSIS — G992 Myelopathy in diseases classified elsewhere: Secondary | ICD-10-CM | POA: Diagnosis not present

## 2024-05-16 DIAGNOSIS — J45909 Unspecified asthma, uncomplicated: Secondary | ICD-10-CM | POA: Diagnosis not present

## 2024-05-16 DIAGNOSIS — E43 Unspecified severe protein-calorie malnutrition: Secondary | ICD-10-CM | POA: Diagnosis not present

## 2024-05-17 DIAGNOSIS — J449 Chronic obstructive pulmonary disease, unspecified: Secondary | ICD-10-CM | POA: Diagnosis not present

## 2024-05-17 DIAGNOSIS — M4802 Spinal stenosis, cervical region: Secondary | ICD-10-CM | POA: Diagnosis not present

## 2024-05-17 DIAGNOSIS — M6284 Sarcopenia: Secondary | ICD-10-CM | POA: Diagnosis not present

## 2024-05-17 DIAGNOSIS — M6281 Muscle weakness (generalized): Secondary | ICD-10-CM | POA: Diagnosis not present

## 2024-05-17 DIAGNOSIS — R64 Cachexia: Secondary | ICD-10-CM | POA: Diagnosis not present

## 2024-05-17 DIAGNOSIS — R251 Tremor, unspecified: Secondary | ICD-10-CM | POA: Diagnosis not present

## 2024-05-17 DIAGNOSIS — I10 Essential (primary) hypertension: Secondary | ICD-10-CM | POA: Diagnosis not present

## 2024-05-17 DIAGNOSIS — G992 Myelopathy in diseases classified elsewhere: Secondary | ICD-10-CM | POA: Diagnosis not present

## 2024-05-17 DIAGNOSIS — Z8673 Personal history of transient ischemic attack (TIA), and cerebral infarction without residual deficits: Secondary | ICD-10-CM | POA: Diagnosis not present

## 2024-05-17 DIAGNOSIS — I69920 Aphasia following unspecified cerebrovascular disease: Secondary | ICD-10-CM | POA: Diagnosis not present

## 2024-05-17 DIAGNOSIS — I69851 Hemiplegia and hemiparesis following other cerebrovascular disease affecting right dominant side: Secondary | ICD-10-CM | POA: Diagnosis not present

## 2024-05-17 DIAGNOSIS — J45909 Unspecified asthma, uncomplicated: Secondary | ICD-10-CM | POA: Diagnosis not present

## 2024-05-17 DIAGNOSIS — D649 Anemia, unspecified: Secondary | ICD-10-CM | POA: Diagnosis not present

## 2024-05-17 DIAGNOSIS — E43 Unspecified severe protein-calorie malnutrition: Secondary | ICD-10-CM | POA: Diagnosis not present

## 2024-05-17 DIAGNOSIS — R279 Unspecified lack of coordination: Secondary | ICD-10-CM | POA: Diagnosis not present

## 2024-05-18 DIAGNOSIS — Z8673 Personal history of transient ischemic attack (TIA), and cerebral infarction without residual deficits: Secondary | ICD-10-CM | POA: Diagnosis not present

## 2024-05-18 DIAGNOSIS — D649 Anemia, unspecified: Secondary | ICD-10-CM | POA: Diagnosis not present

## 2024-05-18 DIAGNOSIS — I69920 Aphasia following unspecified cerebrovascular disease: Secondary | ICD-10-CM | POA: Diagnosis not present

## 2024-05-18 DIAGNOSIS — R251 Tremor, unspecified: Secondary | ICD-10-CM | POA: Diagnosis not present

## 2024-05-18 DIAGNOSIS — I69851 Hemiplegia and hemiparesis following other cerebrovascular disease affecting right dominant side: Secondary | ICD-10-CM | POA: Diagnosis not present

## 2024-05-18 DIAGNOSIS — R64 Cachexia: Secondary | ICD-10-CM | POA: Diagnosis not present

## 2024-05-18 DIAGNOSIS — M6281 Muscle weakness (generalized): Secondary | ICD-10-CM | POA: Diagnosis not present

## 2024-05-18 DIAGNOSIS — J449 Chronic obstructive pulmonary disease, unspecified: Secondary | ICD-10-CM | POA: Diagnosis not present

## 2024-05-18 DIAGNOSIS — J45909 Unspecified asthma, uncomplicated: Secondary | ICD-10-CM | POA: Diagnosis not present

## 2024-05-18 DIAGNOSIS — G992 Myelopathy in diseases classified elsewhere: Secondary | ICD-10-CM | POA: Diagnosis not present

## 2024-05-18 DIAGNOSIS — M4802 Spinal stenosis, cervical region: Secondary | ICD-10-CM | POA: Diagnosis not present

## 2024-05-18 DIAGNOSIS — E43 Unspecified severe protein-calorie malnutrition: Secondary | ICD-10-CM | POA: Diagnosis not present

## 2024-05-18 DIAGNOSIS — M6284 Sarcopenia: Secondary | ICD-10-CM | POA: Diagnosis not present

## 2024-05-18 DIAGNOSIS — I10 Essential (primary) hypertension: Secondary | ICD-10-CM | POA: Diagnosis not present

## 2024-05-18 DIAGNOSIS — R279 Unspecified lack of coordination: Secondary | ICD-10-CM | POA: Diagnosis not present

## 2024-05-19 DIAGNOSIS — M6281 Muscle weakness (generalized): Secondary | ICD-10-CM | POA: Diagnosis not present

## 2024-05-19 DIAGNOSIS — R279 Unspecified lack of coordination: Secondary | ICD-10-CM | POA: Diagnosis not present

## 2024-05-19 DIAGNOSIS — G992 Myelopathy in diseases classified elsewhere: Secondary | ICD-10-CM | POA: Diagnosis not present

## 2024-05-19 DIAGNOSIS — M4802 Spinal stenosis, cervical region: Secondary | ICD-10-CM | POA: Diagnosis not present

## 2024-05-19 DIAGNOSIS — I69851 Hemiplegia and hemiparesis following other cerebrovascular disease affecting right dominant side: Secondary | ICD-10-CM | POA: Diagnosis not present

## 2024-05-19 DIAGNOSIS — J45909 Unspecified asthma, uncomplicated: Secondary | ICD-10-CM | POA: Diagnosis not present

## 2024-05-19 DIAGNOSIS — D649 Anemia, unspecified: Secondary | ICD-10-CM | POA: Diagnosis not present

## 2024-05-19 DIAGNOSIS — M6284 Sarcopenia: Secondary | ICD-10-CM | POA: Diagnosis not present

## 2024-05-19 DIAGNOSIS — Z8673 Personal history of transient ischemic attack (TIA), and cerebral infarction without residual deficits: Secondary | ICD-10-CM | POA: Diagnosis not present

## 2024-05-19 DIAGNOSIS — I10 Essential (primary) hypertension: Secondary | ICD-10-CM | POA: Diagnosis not present

## 2024-05-19 DIAGNOSIS — E43 Unspecified severe protein-calorie malnutrition: Secondary | ICD-10-CM | POA: Diagnosis not present

## 2024-05-19 DIAGNOSIS — R251 Tremor, unspecified: Secondary | ICD-10-CM | POA: Diagnosis not present

## 2024-05-19 DIAGNOSIS — J449 Chronic obstructive pulmonary disease, unspecified: Secondary | ICD-10-CM | POA: Diagnosis not present

## 2024-05-19 DIAGNOSIS — I69920 Aphasia following unspecified cerebrovascular disease: Secondary | ICD-10-CM | POA: Diagnosis not present

## 2024-05-19 DIAGNOSIS — R64 Cachexia: Secondary | ICD-10-CM | POA: Diagnosis not present

## 2024-05-20 DIAGNOSIS — R64 Cachexia: Secondary | ICD-10-CM | POA: Diagnosis not present

## 2024-05-20 DIAGNOSIS — R251 Tremor, unspecified: Secondary | ICD-10-CM | POA: Diagnosis not present

## 2024-05-20 DIAGNOSIS — I10 Essential (primary) hypertension: Secondary | ICD-10-CM | POA: Diagnosis not present

## 2024-05-20 DIAGNOSIS — I69920 Aphasia following unspecified cerebrovascular disease: Secondary | ICD-10-CM | POA: Diagnosis not present

## 2024-05-20 DIAGNOSIS — J45909 Unspecified asthma, uncomplicated: Secondary | ICD-10-CM | POA: Diagnosis not present

## 2024-05-20 DIAGNOSIS — D649 Anemia, unspecified: Secondary | ICD-10-CM | POA: Diagnosis not present

## 2024-05-20 DIAGNOSIS — M4802 Spinal stenosis, cervical region: Secondary | ICD-10-CM | POA: Diagnosis not present

## 2024-05-20 DIAGNOSIS — M6281 Muscle weakness (generalized): Secondary | ICD-10-CM | POA: Diagnosis not present

## 2024-05-20 DIAGNOSIS — I69851 Hemiplegia and hemiparesis following other cerebrovascular disease affecting right dominant side: Secondary | ICD-10-CM | POA: Diagnosis not present

## 2024-05-20 DIAGNOSIS — R279 Unspecified lack of coordination: Secondary | ICD-10-CM | POA: Diagnosis not present

## 2024-05-20 DIAGNOSIS — J449 Chronic obstructive pulmonary disease, unspecified: Secondary | ICD-10-CM | POA: Diagnosis not present

## 2024-05-20 DIAGNOSIS — Z8673 Personal history of transient ischemic attack (TIA), and cerebral infarction without residual deficits: Secondary | ICD-10-CM | POA: Diagnosis not present

## 2024-05-20 DIAGNOSIS — M6284 Sarcopenia: Secondary | ICD-10-CM | POA: Diagnosis not present

## 2024-05-20 DIAGNOSIS — G992 Myelopathy in diseases classified elsewhere: Secondary | ICD-10-CM | POA: Diagnosis not present

## 2024-05-20 DIAGNOSIS — E43 Unspecified severe protein-calorie malnutrition: Secondary | ICD-10-CM | POA: Diagnosis not present

## 2024-05-21 DIAGNOSIS — E43 Unspecified severe protein-calorie malnutrition: Secondary | ICD-10-CM | POA: Diagnosis not present

## 2024-05-21 DIAGNOSIS — R64 Cachexia: Secondary | ICD-10-CM | POA: Diagnosis not present

## 2024-05-21 DIAGNOSIS — M6284 Sarcopenia: Secondary | ICD-10-CM | POA: Diagnosis not present

## 2024-05-21 DIAGNOSIS — I10 Essential (primary) hypertension: Secondary | ICD-10-CM | POA: Diagnosis not present

## 2024-05-21 DIAGNOSIS — I69920 Aphasia following unspecified cerebrovascular disease: Secondary | ICD-10-CM | POA: Diagnosis not present

## 2024-05-21 DIAGNOSIS — M4802 Spinal stenosis, cervical region: Secondary | ICD-10-CM | POA: Diagnosis not present

## 2024-05-21 DIAGNOSIS — D649 Anemia, unspecified: Secondary | ICD-10-CM | POA: Diagnosis not present

## 2024-05-21 DIAGNOSIS — J449 Chronic obstructive pulmonary disease, unspecified: Secondary | ICD-10-CM | POA: Diagnosis not present

## 2024-05-21 DIAGNOSIS — G992 Myelopathy in diseases classified elsewhere: Secondary | ICD-10-CM | POA: Diagnosis not present

## 2024-05-21 DIAGNOSIS — Z8673 Personal history of transient ischemic attack (TIA), and cerebral infarction without residual deficits: Secondary | ICD-10-CM | POA: Diagnosis not present

## 2024-05-21 DIAGNOSIS — M6281 Muscle weakness (generalized): Secondary | ICD-10-CM | POA: Diagnosis not present

## 2024-05-21 DIAGNOSIS — I69851 Hemiplegia and hemiparesis following other cerebrovascular disease affecting right dominant side: Secondary | ICD-10-CM | POA: Diagnosis not present

## 2024-05-21 DIAGNOSIS — J45909 Unspecified asthma, uncomplicated: Secondary | ICD-10-CM | POA: Diagnosis not present

## 2024-05-21 DIAGNOSIS — R251 Tremor, unspecified: Secondary | ICD-10-CM | POA: Diagnosis not present

## 2024-05-21 DIAGNOSIS — R279 Unspecified lack of coordination: Secondary | ICD-10-CM | POA: Diagnosis not present

## 2024-05-22 DIAGNOSIS — I10 Essential (primary) hypertension: Secondary | ICD-10-CM | POA: Diagnosis not present

## 2024-05-22 DIAGNOSIS — M6281 Muscle weakness (generalized): Secondary | ICD-10-CM | POA: Diagnosis not present

## 2024-05-22 DIAGNOSIS — D649 Anemia, unspecified: Secondary | ICD-10-CM | POA: Diagnosis not present

## 2024-05-22 DIAGNOSIS — R64 Cachexia: Secondary | ICD-10-CM | POA: Diagnosis not present

## 2024-05-22 DIAGNOSIS — I69851 Hemiplegia and hemiparesis following other cerebrovascular disease affecting right dominant side: Secondary | ICD-10-CM | POA: Diagnosis not present

## 2024-05-22 DIAGNOSIS — J449 Chronic obstructive pulmonary disease, unspecified: Secondary | ICD-10-CM | POA: Diagnosis not present

## 2024-05-22 DIAGNOSIS — J45909 Unspecified asthma, uncomplicated: Secondary | ICD-10-CM | POA: Diagnosis not present

## 2024-05-22 DIAGNOSIS — E43 Unspecified severe protein-calorie malnutrition: Secondary | ICD-10-CM | POA: Diagnosis not present

## 2024-05-22 DIAGNOSIS — I69920 Aphasia following unspecified cerebrovascular disease: Secondary | ICD-10-CM | POA: Diagnosis not present

## 2024-05-22 DIAGNOSIS — M6284 Sarcopenia: Secondary | ICD-10-CM | POA: Diagnosis not present

## 2024-05-22 DIAGNOSIS — G992 Myelopathy in diseases classified elsewhere: Secondary | ICD-10-CM | POA: Diagnosis not present

## 2024-05-22 DIAGNOSIS — M4802 Spinal stenosis, cervical region: Secondary | ICD-10-CM | POA: Diagnosis not present

## 2024-05-22 DIAGNOSIS — Z8673 Personal history of transient ischemic attack (TIA), and cerebral infarction without residual deficits: Secondary | ICD-10-CM | POA: Diagnosis not present

## 2024-05-22 DIAGNOSIS — R279 Unspecified lack of coordination: Secondary | ICD-10-CM | POA: Diagnosis not present

## 2024-05-22 DIAGNOSIS — R251 Tremor, unspecified: Secondary | ICD-10-CM | POA: Diagnosis not present

## 2024-05-23 DIAGNOSIS — M6281 Muscle weakness (generalized): Secondary | ICD-10-CM | POA: Diagnosis not present

## 2024-05-23 DIAGNOSIS — M6284 Sarcopenia: Secondary | ICD-10-CM | POA: Diagnosis not present

## 2024-05-23 DIAGNOSIS — E43 Unspecified severe protein-calorie malnutrition: Secondary | ICD-10-CM | POA: Diagnosis not present

## 2024-05-23 DIAGNOSIS — G992 Myelopathy in diseases classified elsewhere: Secondary | ICD-10-CM | POA: Diagnosis not present

## 2024-05-23 DIAGNOSIS — I10 Essential (primary) hypertension: Secondary | ICD-10-CM | POA: Diagnosis not present

## 2024-05-23 DIAGNOSIS — R251 Tremor, unspecified: Secondary | ICD-10-CM | POA: Diagnosis not present

## 2024-05-23 DIAGNOSIS — Z8673 Personal history of transient ischemic attack (TIA), and cerebral infarction without residual deficits: Secondary | ICD-10-CM | POA: Diagnosis not present

## 2024-05-23 DIAGNOSIS — D649 Anemia, unspecified: Secondary | ICD-10-CM | POA: Diagnosis not present

## 2024-05-23 DIAGNOSIS — R279 Unspecified lack of coordination: Secondary | ICD-10-CM | POA: Diagnosis not present

## 2024-05-23 DIAGNOSIS — I69851 Hemiplegia and hemiparesis following other cerebrovascular disease affecting right dominant side: Secondary | ICD-10-CM | POA: Diagnosis not present

## 2024-05-23 DIAGNOSIS — J449 Chronic obstructive pulmonary disease, unspecified: Secondary | ICD-10-CM | POA: Diagnosis not present

## 2024-05-23 DIAGNOSIS — J45909 Unspecified asthma, uncomplicated: Secondary | ICD-10-CM | POA: Diagnosis not present

## 2024-05-23 DIAGNOSIS — R64 Cachexia: Secondary | ICD-10-CM | POA: Diagnosis not present

## 2024-05-23 DIAGNOSIS — I69920 Aphasia following unspecified cerebrovascular disease: Secondary | ICD-10-CM | POA: Diagnosis not present

## 2024-05-23 DIAGNOSIS — M4802 Spinal stenosis, cervical region: Secondary | ICD-10-CM | POA: Diagnosis not present

## 2024-05-26 DIAGNOSIS — R64 Cachexia: Secondary | ICD-10-CM | POA: Diagnosis not present

## 2024-05-26 DIAGNOSIS — J449 Chronic obstructive pulmonary disease, unspecified: Secondary | ICD-10-CM | POA: Diagnosis not present

## 2024-05-26 DIAGNOSIS — R279 Unspecified lack of coordination: Secondary | ICD-10-CM | POA: Diagnosis not present

## 2024-05-26 DIAGNOSIS — G992 Myelopathy in diseases classified elsewhere: Secondary | ICD-10-CM | POA: Diagnosis not present

## 2024-05-26 DIAGNOSIS — I10 Essential (primary) hypertension: Secondary | ICD-10-CM | POA: Diagnosis not present

## 2024-05-26 DIAGNOSIS — I69920 Aphasia following unspecified cerebrovascular disease: Secondary | ICD-10-CM | POA: Diagnosis not present

## 2024-05-26 DIAGNOSIS — R251 Tremor, unspecified: Secondary | ICD-10-CM | POA: Diagnosis not present

## 2024-05-26 DIAGNOSIS — M6281 Muscle weakness (generalized): Secondary | ICD-10-CM | POA: Diagnosis not present

## 2024-05-26 DIAGNOSIS — J45909 Unspecified asthma, uncomplicated: Secondary | ICD-10-CM | POA: Diagnosis not present

## 2024-05-26 DIAGNOSIS — M4802 Spinal stenosis, cervical region: Secondary | ICD-10-CM | POA: Diagnosis not present

## 2024-05-26 DIAGNOSIS — D649 Anemia, unspecified: Secondary | ICD-10-CM | POA: Diagnosis not present

## 2024-05-26 DIAGNOSIS — I69851 Hemiplegia and hemiparesis following other cerebrovascular disease affecting right dominant side: Secondary | ICD-10-CM | POA: Diagnosis not present

## 2024-05-26 DIAGNOSIS — E43 Unspecified severe protein-calorie malnutrition: Secondary | ICD-10-CM | POA: Diagnosis not present

## 2024-05-26 DIAGNOSIS — Z8673 Personal history of transient ischemic attack (TIA), and cerebral infarction without residual deficits: Secondary | ICD-10-CM | POA: Diagnosis not present

## 2024-05-26 DIAGNOSIS — M6284 Sarcopenia: Secondary | ICD-10-CM | POA: Diagnosis not present

## 2024-05-27 DIAGNOSIS — E43 Unspecified severe protein-calorie malnutrition: Secondary | ICD-10-CM | POA: Diagnosis not present

## 2024-05-27 DIAGNOSIS — R279 Unspecified lack of coordination: Secondary | ICD-10-CM | POA: Diagnosis not present

## 2024-05-27 DIAGNOSIS — Z8673 Personal history of transient ischemic attack (TIA), and cerebral infarction without residual deficits: Secondary | ICD-10-CM | POA: Diagnosis not present

## 2024-05-27 DIAGNOSIS — D649 Anemia, unspecified: Secondary | ICD-10-CM | POA: Diagnosis not present

## 2024-05-27 DIAGNOSIS — I69920 Aphasia following unspecified cerebrovascular disease: Secondary | ICD-10-CM | POA: Diagnosis not present

## 2024-05-27 DIAGNOSIS — R251 Tremor, unspecified: Secondary | ICD-10-CM | POA: Diagnosis not present

## 2024-05-27 DIAGNOSIS — J45909 Unspecified asthma, uncomplicated: Secondary | ICD-10-CM | POA: Diagnosis not present

## 2024-05-27 DIAGNOSIS — M6281 Muscle weakness (generalized): Secondary | ICD-10-CM | POA: Diagnosis not present

## 2024-05-27 DIAGNOSIS — R64 Cachexia: Secondary | ICD-10-CM | POA: Diagnosis not present

## 2024-05-27 DIAGNOSIS — G992 Myelopathy in diseases classified elsewhere: Secondary | ICD-10-CM | POA: Diagnosis not present

## 2024-05-27 DIAGNOSIS — M6284 Sarcopenia: Secondary | ICD-10-CM | POA: Diagnosis not present

## 2024-05-27 DIAGNOSIS — J449 Chronic obstructive pulmonary disease, unspecified: Secondary | ICD-10-CM | POA: Diagnosis not present

## 2024-05-27 DIAGNOSIS — I10 Essential (primary) hypertension: Secondary | ICD-10-CM | POA: Diagnosis not present

## 2024-05-27 DIAGNOSIS — I69851 Hemiplegia and hemiparesis following other cerebrovascular disease affecting right dominant side: Secondary | ICD-10-CM | POA: Diagnosis not present

## 2024-05-27 DIAGNOSIS — M4802 Spinal stenosis, cervical region: Secondary | ICD-10-CM | POA: Diagnosis not present

## 2024-05-28 DIAGNOSIS — M6281 Muscle weakness (generalized): Secondary | ICD-10-CM | POA: Diagnosis not present

## 2024-05-28 DIAGNOSIS — I69851 Hemiplegia and hemiparesis following other cerebrovascular disease affecting right dominant side: Secondary | ICD-10-CM | POA: Diagnosis not present

## 2024-05-28 DIAGNOSIS — R279 Unspecified lack of coordination: Secondary | ICD-10-CM | POA: Diagnosis not present

## 2024-05-28 DIAGNOSIS — M6284 Sarcopenia: Secondary | ICD-10-CM | POA: Diagnosis not present

## 2024-05-28 DIAGNOSIS — G992 Myelopathy in diseases classified elsewhere: Secondary | ICD-10-CM | POA: Diagnosis not present

## 2024-05-28 DIAGNOSIS — J45909 Unspecified asthma, uncomplicated: Secondary | ICD-10-CM | POA: Diagnosis not present

## 2024-05-28 DIAGNOSIS — I69391 Dysphagia following cerebral infarction: Secondary | ICD-10-CM | POA: Diagnosis not present

## 2024-05-28 DIAGNOSIS — J449 Chronic obstructive pulmonary disease, unspecified: Secondary | ICD-10-CM | POA: Diagnosis not present

## 2024-05-28 DIAGNOSIS — E43 Unspecified severe protein-calorie malnutrition: Secondary | ICD-10-CM | POA: Diagnosis not present

## 2024-05-28 DIAGNOSIS — R251 Tremor, unspecified: Secondary | ICD-10-CM | POA: Diagnosis not present

## 2024-05-28 DIAGNOSIS — I69920 Aphasia following unspecified cerebrovascular disease: Secondary | ICD-10-CM | POA: Diagnosis not present

## 2024-05-28 DIAGNOSIS — I10 Essential (primary) hypertension: Secondary | ICD-10-CM | POA: Diagnosis not present

## 2024-05-28 DIAGNOSIS — D649 Anemia, unspecified: Secondary | ICD-10-CM | POA: Diagnosis not present

## 2024-05-28 DIAGNOSIS — R64 Cachexia: Secondary | ICD-10-CM | POA: Diagnosis not present

## 2024-05-28 DIAGNOSIS — M4802 Spinal stenosis, cervical region: Secondary | ICD-10-CM | POA: Diagnosis not present

## 2024-05-28 DIAGNOSIS — Z8673 Personal history of transient ischemic attack (TIA), and cerebral infarction without residual deficits: Secondary | ICD-10-CM | POA: Diagnosis not present

## 2024-05-30 DIAGNOSIS — I10 Essential (primary) hypertension: Secondary | ICD-10-CM | POA: Diagnosis not present

## 2024-05-30 DIAGNOSIS — R64 Cachexia: Secondary | ICD-10-CM | POA: Diagnosis not present

## 2024-05-30 DIAGNOSIS — I69851 Hemiplegia and hemiparesis following other cerebrovascular disease affecting right dominant side: Secondary | ICD-10-CM | POA: Diagnosis not present

## 2024-05-30 DIAGNOSIS — R251 Tremor, unspecified: Secondary | ICD-10-CM | POA: Diagnosis not present

## 2024-05-30 DIAGNOSIS — M6281 Muscle weakness (generalized): Secondary | ICD-10-CM | POA: Diagnosis not present

## 2024-05-30 DIAGNOSIS — M4802 Spinal stenosis, cervical region: Secondary | ICD-10-CM | POA: Diagnosis not present

## 2024-05-30 DIAGNOSIS — G992 Myelopathy in diseases classified elsewhere: Secondary | ICD-10-CM | POA: Diagnosis not present

## 2024-05-30 DIAGNOSIS — E43 Unspecified severe protein-calorie malnutrition: Secondary | ICD-10-CM | POA: Diagnosis not present

## 2024-05-30 DIAGNOSIS — J45909 Unspecified asthma, uncomplicated: Secondary | ICD-10-CM | POA: Diagnosis not present

## 2024-05-30 DIAGNOSIS — D649 Anemia, unspecified: Secondary | ICD-10-CM | POA: Diagnosis not present

## 2024-05-30 DIAGNOSIS — J449 Chronic obstructive pulmonary disease, unspecified: Secondary | ICD-10-CM | POA: Diagnosis not present

## 2024-05-30 DIAGNOSIS — I69920 Aphasia following unspecified cerebrovascular disease: Secondary | ICD-10-CM | POA: Diagnosis not present

## 2024-05-30 DIAGNOSIS — Z8673 Personal history of transient ischemic attack (TIA), and cerebral infarction without residual deficits: Secondary | ICD-10-CM | POA: Diagnosis not present

## 2024-05-30 DIAGNOSIS — R279 Unspecified lack of coordination: Secondary | ICD-10-CM | POA: Diagnosis not present

## 2024-05-30 DIAGNOSIS — M6284 Sarcopenia: Secondary | ICD-10-CM | POA: Diagnosis not present

## 2024-06-02 DIAGNOSIS — I10 Essential (primary) hypertension: Secondary | ICD-10-CM | POA: Diagnosis not present

## 2024-06-02 DIAGNOSIS — M6281 Muscle weakness (generalized): Secondary | ICD-10-CM | POA: Diagnosis not present

## 2024-06-02 DIAGNOSIS — D649 Anemia, unspecified: Secondary | ICD-10-CM | POA: Diagnosis not present

## 2024-06-02 DIAGNOSIS — J45909 Unspecified asthma, uncomplicated: Secondary | ICD-10-CM | POA: Diagnosis not present

## 2024-06-02 DIAGNOSIS — Z8673 Personal history of transient ischemic attack (TIA), and cerebral infarction without residual deficits: Secondary | ICD-10-CM | POA: Diagnosis not present

## 2024-06-02 DIAGNOSIS — M6284 Sarcopenia: Secondary | ICD-10-CM | POA: Diagnosis not present

## 2024-06-02 DIAGNOSIS — J449 Chronic obstructive pulmonary disease, unspecified: Secondary | ICD-10-CM | POA: Diagnosis not present

## 2024-06-02 DIAGNOSIS — I69851 Hemiplegia and hemiparesis following other cerebrovascular disease affecting right dominant side: Secondary | ICD-10-CM | POA: Diagnosis not present

## 2024-06-02 DIAGNOSIS — I69920 Aphasia following unspecified cerebrovascular disease: Secondary | ICD-10-CM | POA: Diagnosis not present

## 2024-06-02 DIAGNOSIS — R64 Cachexia: Secondary | ICD-10-CM | POA: Diagnosis not present

## 2024-06-02 DIAGNOSIS — M4802 Spinal stenosis, cervical region: Secondary | ICD-10-CM | POA: Diagnosis not present

## 2024-06-02 DIAGNOSIS — G992 Myelopathy in diseases classified elsewhere: Secondary | ICD-10-CM | POA: Diagnosis not present

## 2024-06-02 DIAGNOSIS — R251 Tremor, unspecified: Secondary | ICD-10-CM | POA: Diagnosis not present

## 2024-06-02 DIAGNOSIS — E43 Unspecified severe protein-calorie malnutrition: Secondary | ICD-10-CM | POA: Diagnosis not present

## 2024-06-02 DIAGNOSIS — R279 Unspecified lack of coordination: Secondary | ICD-10-CM | POA: Diagnosis not present

## 2024-06-03 DIAGNOSIS — M6284 Sarcopenia: Secondary | ICD-10-CM | POA: Diagnosis not present

## 2024-06-03 DIAGNOSIS — M6281 Muscle weakness (generalized): Secondary | ICD-10-CM | POA: Diagnosis not present

## 2024-06-03 DIAGNOSIS — D649 Anemia, unspecified: Secondary | ICD-10-CM | POA: Diagnosis not present

## 2024-06-03 DIAGNOSIS — R251 Tremor, unspecified: Secondary | ICD-10-CM | POA: Diagnosis not present

## 2024-06-03 DIAGNOSIS — I10 Essential (primary) hypertension: Secondary | ICD-10-CM | POA: Diagnosis not present

## 2024-06-03 DIAGNOSIS — J45909 Unspecified asthma, uncomplicated: Secondary | ICD-10-CM | POA: Diagnosis not present

## 2024-06-03 DIAGNOSIS — G992 Myelopathy in diseases classified elsewhere: Secondary | ICD-10-CM | POA: Diagnosis not present

## 2024-06-03 DIAGNOSIS — E43 Unspecified severe protein-calorie malnutrition: Secondary | ICD-10-CM | POA: Diagnosis not present

## 2024-06-03 DIAGNOSIS — I69851 Hemiplegia and hemiparesis following other cerebrovascular disease affecting right dominant side: Secondary | ICD-10-CM | POA: Diagnosis not present

## 2024-06-03 DIAGNOSIS — R279 Unspecified lack of coordination: Secondary | ICD-10-CM | POA: Diagnosis not present

## 2024-06-03 DIAGNOSIS — Z8673 Personal history of transient ischemic attack (TIA), and cerebral infarction without residual deficits: Secondary | ICD-10-CM | POA: Diagnosis not present

## 2024-06-03 DIAGNOSIS — R64 Cachexia: Secondary | ICD-10-CM | POA: Diagnosis not present

## 2024-06-03 DIAGNOSIS — J449 Chronic obstructive pulmonary disease, unspecified: Secondary | ICD-10-CM | POA: Diagnosis not present

## 2024-06-03 DIAGNOSIS — I69920 Aphasia following unspecified cerebrovascular disease: Secondary | ICD-10-CM | POA: Diagnosis not present

## 2024-06-03 DIAGNOSIS — M4802 Spinal stenosis, cervical region: Secondary | ICD-10-CM | POA: Diagnosis not present

## 2024-06-04 DIAGNOSIS — M4802 Spinal stenosis, cervical region: Secondary | ICD-10-CM | POA: Diagnosis not present

## 2024-06-04 DIAGNOSIS — M6284 Sarcopenia: Secondary | ICD-10-CM | POA: Diagnosis not present

## 2024-06-04 DIAGNOSIS — I69851 Hemiplegia and hemiparesis following other cerebrovascular disease affecting right dominant side: Secondary | ICD-10-CM | POA: Diagnosis not present

## 2024-06-04 DIAGNOSIS — M6281 Muscle weakness (generalized): Secondary | ICD-10-CM | POA: Diagnosis not present

## 2024-06-04 DIAGNOSIS — R279 Unspecified lack of coordination: Secondary | ICD-10-CM | POA: Diagnosis not present

## 2024-06-04 DIAGNOSIS — D649 Anemia, unspecified: Secondary | ICD-10-CM | POA: Diagnosis not present

## 2024-06-04 DIAGNOSIS — I10 Essential (primary) hypertension: Secondary | ICD-10-CM | POA: Diagnosis not present

## 2024-06-04 DIAGNOSIS — J45909 Unspecified asthma, uncomplicated: Secondary | ICD-10-CM | POA: Diagnosis not present

## 2024-06-04 DIAGNOSIS — Z8673 Personal history of transient ischemic attack (TIA), and cerebral infarction without residual deficits: Secondary | ICD-10-CM | POA: Diagnosis not present

## 2024-06-04 DIAGNOSIS — E43 Unspecified severe protein-calorie malnutrition: Secondary | ICD-10-CM | POA: Diagnosis not present

## 2024-06-04 DIAGNOSIS — I69920 Aphasia following unspecified cerebrovascular disease: Secondary | ICD-10-CM | POA: Diagnosis not present

## 2024-06-04 DIAGNOSIS — J449 Chronic obstructive pulmonary disease, unspecified: Secondary | ICD-10-CM | POA: Diagnosis not present

## 2024-06-04 DIAGNOSIS — R64 Cachexia: Secondary | ICD-10-CM | POA: Diagnosis not present

## 2024-06-04 DIAGNOSIS — G992 Myelopathy in diseases classified elsewhere: Secondary | ICD-10-CM | POA: Diagnosis not present

## 2024-06-04 DIAGNOSIS — R251 Tremor, unspecified: Secondary | ICD-10-CM | POA: Diagnosis not present

## 2024-06-05 DIAGNOSIS — M4802 Spinal stenosis, cervical region: Secondary | ICD-10-CM | POA: Diagnosis not present

## 2024-06-05 DIAGNOSIS — J45909 Unspecified asthma, uncomplicated: Secondary | ICD-10-CM | POA: Diagnosis not present

## 2024-06-05 DIAGNOSIS — Z8673 Personal history of transient ischemic attack (TIA), and cerebral infarction without residual deficits: Secondary | ICD-10-CM | POA: Diagnosis not present

## 2024-06-05 DIAGNOSIS — R64 Cachexia: Secondary | ICD-10-CM | POA: Diagnosis not present

## 2024-06-05 DIAGNOSIS — I69920 Aphasia following unspecified cerebrovascular disease: Secondary | ICD-10-CM | POA: Diagnosis not present

## 2024-06-05 DIAGNOSIS — J449 Chronic obstructive pulmonary disease, unspecified: Secondary | ICD-10-CM | POA: Diagnosis not present

## 2024-06-05 DIAGNOSIS — M6284 Sarcopenia: Secondary | ICD-10-CM | POA: Diagnosis not present

## 2024-06-05 DIAGNOSIS — I69851 Hemiplegia and hemiparesis following other cerebrovascular disease affecting right dominant side: Secondary | ICD-10-CM | POA: Diagnosis not present

## 2024-06-05 DIAGNOSIS — I10 Essential (primary) hypertension: Secondary | ICD-10-CM | POA: Diagnosis not present

## 2024-06-05 DIAGNOSIS — R279 Unspecified lack of coordination: Secondary | ICD-10-CM | POA: Diagnosis not present

## 2024-06-05 DIAGNOSIS — M6281 Muscle weakness (generalized): Secondary | ICD-10-CM | POA: Diagnosis not present

## 2024-06-05 DIAGNOSIS — R251 Tremor, unspecified: Secondary | ICD-10-CM | POA: Diagnosis not present

## 2024-06-05 DIAGNOSIS — D649 Anemia, unspecified: Secondary | ICD-10-CM | POA: Diagnosis not present

## 2024-06-05 DIAGNOSIS — G992 Myelopathy in diseases classified elsewhere: Secondary | ICD-10-CM | POA: Diagnosis not present

## 2024-06-05 DIAGNOSIS — E43 Unspecified severe protein-calorie malnutrition: Secondary | ICD-10-CM | POA: Diagnosis not present

## 2024-06-06 DIAGNOSIS — Z8673 Personal history of transient ischemic attack (TIA), and cerebral infarction without residual deficits: Secondary | ICD-10-CM | POA: Diagnosis not present

## 2024-06-06 DIAGNOSIS — R64 Cachexia: Secondary | ICD-10-CM | POA: Diagnosis not present

## 2024-06-06 DIAGNOSIS — R251 Tremor, unspecified: Secondary | ICD-10-CM | POA: Diagnosis not present

## 2024-06-06 DIAGNOSIS — D649 Anemia, unspecified: Secondary | ICD-10-CM | POA: Diagnosis not present

## 2024-06-06 DIAGNOSIS — M4802 Spinal stenosis, cervical region: Secondary | ICD-10-CM | POA: Diagnosis not present

## 2024-06-06 DIAGNOSIS — I69851 Hemiplegia and hemiparesis following other cerebrovascular disease affecting right dominant side: Secondary | ICD-10-CM | POA: Diagnosis not present

## 2024-06-06 DIAGNOSIS — M6284 Sarcopenia: Secondary | ICD-10-CM | POA: Diagnosis not present

## 2024-06-06 DIAGNOSIS — G992 Myelopathy in diseases classified elsewhere: Secondary | ICD-10-CM | POA: Diagnosis not present

## 2024-06-06 DIAGNOSIS — R279 Unspecified lack of coordination: Secondary | ICD-10-CM | POA: Diagnosis not present

## 2024-06-06 DIAGNOSIS — I69920 Aphasia following unspecified cerebrovascular disease: Secondary | ICD-10-CM | POA: Diagnosis not present

## 2024-06-06 DIAGNOSIS — E43 Unspecified severe protein-calorie malnutrition: Secondary | ICD-10-CM | POA: Diagnosis not present

## 2024-06-06 DIAGNOSIS — J45909 Unspecified asthma, uncomplicated: Secondary | ICD-10-CM | POA: Diagnosis not present

## 2024-06-06 DIAGNOSIS — I10 Essential (primary) hypertension: Secondary | ICD-10-CM | POA: Diagnosis not present

## 2024-06-06 DIAGNOSIS — J449 Chronic obstructive pulmonary disease, unspecified: Secondary | ICD-10-CM | POA: Diagnosis not present

## 2024-06-06 DIAGNOSIS — M6281 Muscle weakness (generalized): Secondary | ICD-10-CM | POA: Diagnosis not present

## 2024-06-09 DIAGNOSIS — B372 Candidiasis of skin and nail: Secondary | ICD-10-CM | POA: Diagnosis not present

## 2024-06-09 DIAGNOSIS — E43 Unspecified severe protein-calorie malnutrition: Secondary | ICD-10-CM | POA: Diagnosis not present

## 2024-06-09 DIAGNOSIS — D649 Anemia, unspecified: Secondary | ICD-10-CM | POA: Diagnosis not present

## 2024-06-09 DIAGNOSIS — J449 Chronic obstructive pulmonary disease, unspecified: Secondary | ICD-10-CM | POA: Diagnosis not present

## 2024-06-09 DIAGNOSIS — R64 Cachexia: Secondary | ICD-10-CM | POA: Diagnosis not present

## 2024-06-09 DIAGNOSIS — R279 Unspecified lack of coordination: Secondary | ICD-10-CM | POA: Diagnosis not present

## 2024-06-09 DIAGNOSIS — M6281 Muscle weakness (generalized): Secondary | ICD-10-CM | POA: Diagnosis not present

## 2024-06-09 DIAGNOSIS — J45909 Unspecified asthma, uncomplicated: Secondary | ICD-10-CM | POA: Diagnosis not present

## 2024-06-09 DIAGNOSIS — I69851 Hemiplegia and hemiparesis following other cerebrovascular disease affecting right dominant side: Secondary | ICD-10-CM | POA: Diagnosis not present

## 2024-06-09 DIAGNOSIS — R251 Tremor, unspecified: Secondary | ICD-10-CM | POA: Diagnosis not present

## 2024-06-09 DIAGNOSIS — I69920 Aphasia following unspecified cerebrovascular disease: Secondary | ICD-10-CM | POA: Diagnosis not present

## 2024-06-09 DIAGNOSIS — M6284 Sarcopenia: Secondary | ICD-10-CM | POA: Diagnosis not present

## 2024-06-09 DIAGNOSIS — Z8673 Personal history of transient ischemic attack (TIA), and cerebral infarction without residual deficits: Secondary | ICD-10-CM | POA: Diagnosis not present

## 2024-06-09 DIAGNOSIS — M4802 Spinal stenosis, cervical region: Secondary | ICD-10-CM | POA: Diagnosis not present

## 2024-06-09 DIAGNOSIS — G992 Myelopathy in diseases classified elsewhere: Secondary | ICD-10-CM | POA: Diagnosis not present

## 2024-06-09 DIAGNOSIS — I10 Essential (primary) hypertension: Secondary | ICD-10-CM | POA: Diagnosis not present

## 2024-06-10 DIAGNOSIS — R64 Cachexia: Secondary | ICD-10-CM | POA: Diagnosis not present

## 2024-06-10 DIAGNOSIS — I69920 Aphasia following unspecified cerebrovascular disease: Secondary | ICD-10-CM | POA: Diagnosis not present

## 2024-06-10 DIAGNOSIS — I69851 Hemiplegia and hemiparesis following other cerebrovascular disease affecting right dominant side: Secondary | ICD-10-CM | POA: Diagnosis not present

## 2024-06-10 DIAGNOSIS — J45909 Unspecified asthma, uncomplicated: Secondary | ICD-10-CM | POA: Diagnosis not present

## 2024-06-10 DIAGNOSIS — M6284 Sarcopenia: Secondary | ICD-10-CM | POA: Diagnosis not present

## 2024-06-10 DIAGNOSIS — E43 Unspecified severe protein-calorie malnutrition: Secondary | ICD-10-CM | POA: Diagnosis not present

## 2024-06-10 DIAGNOSIS — D649 Anemia, unspecified: Secondary | ICD-10-CM | POA: Diagnosis not present

## 2024-06-10 DIAGNOSIS — M4802 Spinal stenosis, cervical region: Secondary | ICD-10-CM | POA: Diagnosis not present

## 2024-06-10 DIAGNOSIS — R279 Unspecified lack of coordination: Secondary | ICD-10-CM | POA: Diagnosis not present

## 2024-06-10 DIAGNOSIS — R251 Tremor, unspecified: Secondary | ICD-10-CM | POA: Diagnosis not present

## 2024-06-10 DIAGNOSIS — J449 Chronic obstructive pulmonary disease, unspecified: Secondary | ICD-10-CM | POA: Diagnosis not present

## 2024-06-10 DIAGNOSIS — M6281 Muscle weakness (generalized): Secondary | ICD-10-CM | POA: Diagnosis not present

## 2024-06-10 DIAGNOSIS — I10 Essential (primary) hypertension: Secondary | ICD-10-CM | POA: Diagnosis not present

## 2024-06-10 DIAGNOSIS — G992 Myelopathy in diseases classified elsewhere: Secondary | ICD-10-CM | POA: Diagnosis not present

## 2024-06-10 DIAGNOSIS — Z8673 Personal history of transient ischemic attack (TIA), and cerebral infarction without residual deficits: Secondary | ICD-10-CM | POA: Diagnosis not present

## 2024-06-11 DIAGNOSIS — I10 Essential (primary) hypertension: Secondary | ICD-10-CM | POA: Diagnosis not present

## 2024-06-11 DIAGNOSIS — J45909 Unspecified asthma, uncomplicated: Secondary | ICD-10-CM | POA: Diagnosis not present

## 2024-06-11 DIAGNOSIS — G992 Myelopathy in diseases classified elsewhere: Secondary | ICD-10-CM | POA: Diagnosis not present

## 2024-06-11 DIAGNOSIS — I69920 Aphasia following unspecified cerebrovascular disease: Secondary | ICD-10-CM | POA: Diagnosis not present

## 2024-06-11 DIAGNOSIS — M6284 Sarcopenia: Secondary | ICD-10-CM | POA: Diagnosis not present

## 2024-06-11 DIAGNOSIS — R279 Unspecified lack of coordination: Secondary | ICD-10-CM | POA: Diagnosis not present

## 2024-06-11 DIAGNOSIS — J449 Chronic obstructive pulmonary disease, unspecified: Secondary | ICD-10-CM | POA: Diagnosis not present

## 2024-06-11 DIAGNOSIS — Z8673 Personal history of transient ischemic attack (TIA), and cerebral infarction without residual deficits: Secondary | ICD-10-CM | POA: Diagnosis not present

## 2024-06-11 DIAGNOSIS — M6281 Muscle weakness (generalized): Secondary | ICD-10-CM | POA: Diagnosis not present

## 2024-06-11 DIAGNOSIS — R64 Cachexia: Secondary | ICD-10-CM | POA: Diagnosis not present

## 2024-06-11 DIAGNOSIS — R251 Tremor, unspecified: Secondary | ICD-10-CM | POA: Diagnosis not present

## 2024-06-11 DIAGNOSIS — I69851 Hemiplegia and hemiparesis following other cerebrovascular disease affecting right dominant side: Secondary | ICD-10-CM | POA: Diagnosis not present

## 2024-06-11 DIAGNOSIS — M4802 Spinal stenosis, cervical region: Secondary | ICD-10-CM | POA: Diagnosis not present

## 2024-06-11 DIAGNOSIS — E43 Unspecified severe protein-calorie malnutrition: Secondary | ICD-10-CM | POA: Diagnosis not present

## 2024-06-11 DIAGNOSIS — D649 Anemia, unspecified: Secondary | ICD-10-CM | POA: Diagnosis not present

## 2024-06-12 DIAGNOSIS — G992 Myelopathy in diseases classified elsewhere: Secondary | ICD-10-CM | POA: Diagnosis not present

## 2024-06-12 DIAGNOSIS — R279 Unspecified lack of coordination: Secondary | ICD-10-CM | POA: Diagnosis not present

## 2024-06-12 DIAGNOSIS — E43 Unspecified severe protein-calorie malnutrition: Secondary | ICD-10-CM | POA: Diagnosis not present

## 2024-06-12 DIAGNOSIS — J45909 Unspecified asthma, uncomplicated: Secondary | ICD-10-CM | POA: Diagnosis not present

## 2024-06-12 DIAGNOSIS — Z8673 Personal history of transient ischemic attack (TIA), and cerebral infarction without residual deficits: Secondary | ICD-10-CM | POA: Diagnosis not present

## 2024-06-12 DIAGNOSIS — D649 Anemia, unspecified: Secondary | ICD-10-CM | POA: Diagnosis not present

## 2024-06-12 DIAGNOSIS — M6284 Sarcopenia: Secondary | ICD-10-CM | POA: Diagnosis not present

## 2024-06-12 DIAGNOSIS — I69851 Hemiplegia and hemiparesis following other cerebrovascular disease affecting right dominant side: Secondary | ICD-10-CM | POA: Diagnosis not present

## 2024-06-12 DIAGNOSIS — R251 Tremor, unspecified: Secondary | ICD-10-CM | POA: Diagnosis not present

## 2024-06-12 DIAGNOSIS — M6281 Muscle weakness (generalized): Secondary | ICD-10-CM | POA: Diagnosis not present

## 2024-06-12 DIAGNOSIS — J449 Chronic obstructive pulmonary disease, unspecified: Secondary | ICD-10-CM | POA: Diagnosis not present

## 2024-06-12 DIAGNOSIS — R64 Cachexia: Secondary | ICD-10-CM | POA: Diagnosis not present

## 2024-06-12 DIAGNOSIS — I10 Essential (primary) hypertension: Secondary | ICD-10-CM | POA: Diagnosis not present

## 2024-06-12 DIAGNOSIS — I69920 Aphasia following unspecified cerebrovascular disease: Secondary | ICD-10-CM | POA: Diagnosis not present

## 2024-06-12 DIAGNOSIS — M4802 Spinal stenosis, cervical region: Secondary | ICD-10-CM | POA: Diagnosis not present

## 2024-06-13 DIAGNOSIS — M6284 Sarcopenia: Secondary | ICD-10-CM | POA: Diagnosis not present

## 2024-06-13 DIAGNOSIS — I10 Essential (primary) hypertension: Secondary | ICD-10-CM | POA: Diagnosis not present

## 2024-06-13 DIAGNOSIS — D649 Anemia, unspecified: Secondary | ICD-10-CM | POA: Diagnosis not present

## 2024-06-13 DIAGNOSIS — M4802 Spinal stenosis, cervical region: Secondary | ICD-10-CM | POA: Diagnosis not present

## 2024-06-13 DIAGNOSIS — M6281 Muscle weakness (generalized): Secondary | ICD-10-CM | POA: Diagnosis not present

## 2024-06-13 DIAGNOSIS — E43 Unspecified severe protein-calorie malnutrition: Secondary | ICD-10-CM | POA: Diagnosis not present

## 2024-06-13 DIAGNOSIS — J449 Chronic obstructive pulmonary disease, unspecified: Secondary | ICD-10-CM | POA: Diagnosis not present

## 2024-06-13 DIAGNOSIS — Z8673 Personal history of transient ischemic attack (TIA), and cerebral infarction without residual deficits: Secondary | ICD-10-CM | POA: Diagnosis not present

## 2024-06-13 DIAGNOSIS — I69851 Hemiplegia and hemiparesis following other cerebrovascular disease affecting right dominant side: Secondary | ICD-10-CM | POA: Diagnosis not present

## 2024-06-13 DIAGNOSIS — G992 Myelopathy in diseases classified elsewhere: Secondary | ICD-10-CM | POA: Diagnosis not present

## 2024-06-13 DIAGNOSIS — R251 Tremor, unspecified: Secondary | ICD-10-CM | POA: Diagnosis not present

## 2024-06-13 DIAGNOSIS — R279 Unspecified lack of coordination: Secondary | ICD-10-CM | POA: Diagnosis not present

## 2024-06-13 DIAGNOSIS — I69920 Aphasia following unspecified cerebrovascular disease: Secondary | ICD-10-CM | POA: Diagnosis not present

## 2024-06-13 DIAGNOSIS — J45909 Unspecified asthma, uncomplicated: Secondary | ICD-10-CM | POA: Diagnosis not present

## 2024-06-13 DIAGNOSIS — R64 Cachexia: Secondary | ICD-10-CM | POA: Diagnosis not present

## 2024-06-16 DIAGNOSIS — D649 Anemia, unspecified: Secondary | ICD-10-CM | POA: Diagnosis not present

## 2024-06-16 DIAGNOSIS — J45909 Unspecified asthma, uncomplicated: Secondary | ICD-10-CM | POA: Diagnosis not present

## 2024-06-16 DIAGNOSIS — I10 Essential (primary) hypertension: Secondary | ICD-10-CM | POA: Diagnosis not present

## 2024-06-16 DIAGNOSIS — M4802 Spinal stenosis, cervical region: Secondary | ICD-10-CM | POA: Diagnosis not present

## 2024-06-16 DIAGNOSIS — R279 Unspecified lack of coordination: Secondary | ICD-10-CM | POA: Diagnosis not present

## 2024-06-16 DIAGNOSIS — M6284 Sarcopenia: Secondary | ICD-10-CM | POA: Diagnosis not present

## 2024-06-16 DIAGNOSIS — E43 Unspecified severe protein-calorie malnutrition: Secondary | ICD-10-CM | POA: Diagnosis not present

## 2024-06-16 DIAGNOSIS — I69920 Aphasia following unspecified cerebrovascular disease: Secondary | ICD-10-CM | POA: Diagnosis not present

## 2024-06-16 DIAGNOSIS — I69851 Hemiplegia and hemiparesis following other cerebrovascular disease affecting right dominant side: Secondary | ICD-10-CM | POA: Diagnosis not present

## 2024-06-16 DIAGNOSIS — M6281 Muscle weakness (generalized): Secondary | ICD-10-CM | POA: Diagnosis not present

## 2024-06-16 DIAGNOSIS — R64 Cachexia: Secondary | ICD-10-CM | POA: Diagnosis not present

## 2024-06-16 DIAGNOSIS — Z8673 Personal history of transient ischemic attack (TIA), and cerebral infarction without residual deficits: Secondary | ICD-10-CM | POA: Diagnosis not present

## 2024-06-16 DIAGNOSIS — J449 Chronic obstructive pulmonary disease, unspecified: Secondary | ICD-10-CM | POA: Diagnosis not present

## 2024-06-16 DIAGNOSIS — G992 Myelopathy in diseases classified elsewhere: Secondary | ICD-10-CM | POA: Diagnosis not present

## 2024-06-16 DIAGNOSIS — R251 Tremor, unspecified: Secondary | ICD-10-CM | POA: Diagnosis not present

## 2024-06-17 DIAGNOSIS — M4802 Spinal stenosis, cervical region: Secondary | ICD-10-CM | POA: Diagnosis not present

## 2024-06-17 DIAGNOSIS — I10 Essential (primary) hypertension: Secondary | ICD-10-CM | POA: Diagnosis not present

## 2024-06-17 DIAGNOSIS — J45909 Unspecified asthma, uncomplicated: Secondary | ICD-10-CM | POA: Diagnosis not present

## 2024-06-17 DIAGNOSIS — J449 Chronic obstructive pulmonary disease, unspecified: Secondary | ICD-10-CM | POA: Diagnosis not present

## 2024-06-17 DIAGNOSIS — D649 Anemia, unspecified: Secondary | ICD-10-CM | POA: Diagnosis not present

## 2024-06-17 DIAGNOSIS — M6281 Muscle weakness (generalized): Secondary | ICD-10-CM | POA: Diagnosis not present

## 2024-06-17 DIAGNOSIS — R64 Cachexia: Secondary | ICD-10-CM | POA: Diagnosis not present

## 2024-06-17 DIAGNOSIS — I69851 Hemiplegia and hemiparesis following other cerebrovascular disease affecting right dominant side: Secondary | ICD-10-CM | POA: Diagnosis not present

## 2024-06-17 DIAGNOSIS — Z8673 Personal history of transient ischemic attack (TIA), and cerebral infarction without residual deficits: Secondary | ICD-10-CM | POA: Diagnosis not present

## 2024-06-17 DIAGNOSIS — R279 Unspecified lack of coordination: Secondary | ICD-10-CM | POA: Diagnosis not present

## 2024-06-17 DIAGNOSIS — G992 Myelopathy in diseases classified elsewhere: Secondary | ICD-10-CM | POA: Diagnosis not present

## 2024-06-17 DIAGNOSIS — R251 Tremor, unspecified: Secondary | ICD-10-CM | POA: Diagnosis not present

## 2024-06-17 DIAGNOSIS — E43 Unspecified severe protein-calorie malnutrition: Secondary | ICD-10-CM | POA: Diagnosis not present

## 2024-06-17 DIAGNOSIS — I69920 Aphasia following unspecified cerebrovascular disease: Secondary | ICD-10-CM | POA: Diagnosis not present

## 2024-06-17 DIAGNOSIS — M6284 Sarcopenia: Secondary | ICD-10-CM | POA: Diagnosis not present

## 2024-06-18 DIAGNOSIS — G992 Myelopathy in diseases classified elsewhere: Secondary | ICD-10-CM | POA: Diagnosis not present

## 2024-06-18 DIAGNOSIS — J449 Chronic obstructive pulmonary disease, unspecified: Secondary | ICD-10-CM | POA: Diagnosis not present

## 2024-06-18 DIAGNOSIS — I10 Essential (primary) hypertension: Secondary | ICD-10-CM | POA: Diagnosis not present

## 2024-06-18 DIAGNOSIS — R279 Unspecified lack of coordination: Secondary | ICD-10-CM | POA: Diagnosis not present

## 2024-06-18 DIAGNOSIS — M6284 Sarcopenia: Secondary | ICD-10-CM | POA: Diagnosis not present

## 2024-06-18 DIAGNOSIS — Z8673 Personal history of transient ischemic attack (TIA), and cerebral infarction without residual deficits: Secondary | ICD-10-CM | POA: Diagnosis not present

## 2024-06-18 DIAGNOSIS — R64 Cachexia: Secondary | ICD-10-CM | POA: Diagnosis not present

## 2024-06-18 DIAGNOSIS — I69920 Aphasia following unspecified cerebrovascular disease: Secondary | ICD-10-CM | POA: Diagnosis not present

## 2024-06-18 DIAGNOSIS — J45909 Unspecified asthma, uncomplicated: Secondary | ICD-10-CM | POA: Diagnosis not present

## 2024-06-18 DIAGNOSIS — M6281 Muscle weakness (generalized): Secondary | ICD-10-CM | POA: Diagnosis not present

## 2024-06-18 DIAGNOSIS — E43 Unspecified severe protein-calorie malnutrition: Secondary | ICD-10-CM | POA: Diagnosis not present

## 2024-06-18 DIAGNOSIS — I69851 Hemiplegia and hemiparesis following other cerebrovascular disease affecting right dominant side: Secondary | ICD-10-CM | POA: Diagnosis not present

## 2024-06-18 DIAGNOSIS — D649 Anemia, unspecified: Secondary | ICD-10-CM | POA: Diagnosis not present

## 2024-06-18 DIAGNOSIS — M4802 Spinal stenosis, cervical region: Secondary | ICD-10-CM | POA: Diagnosis not present

## 2024-06-18 DIAGNOSIS — R251 Tremor, unspecified: Secondary | ICD-10-CM | POA: Diagnosis not present

## 2024-06-19 DIAGNOSIS — I69851 Hemiplegia and hemiparesis following other cerebrovascular disease affecting right dominant side: Secondary | ICD-10-CM | POA: Diagnosis not present

## 2024-06-19 DIAGNOSIS — R251 Tremor, unspecified: Secondary | ICD-10-CM | POA: Diagnosis not present

## 2024-06-19 DIAGNOSIS — R279 Unspecified lack of coordination: Secondary | ICD-10-CM | POA: Diagnosis not present

## 2024-06-19 DIAGNOSIS — M4802 Spinal stenosis, cervical region: Secondary | ICD-10-CM | POA: Diagnosis not present

## 2024-06-19 DIAGNOSIS — D649 Anemia, unspecified: Secondary | ICD-10-CM | POA: Diagnosis not present

## 2024-06-19 DIAGNOSIS — Z8673 Personal history of transient ischemic attack (TIA), and cerebral infarction without residual deficits: Secondary | ICD-10-CM | POA: Diagnosis not present

## 2024-06-19 DIAGNOSIS — G992 Myelopathy in diseases classified elsewhere: Secondary | ICD-10-CM | POA: Diagnosis not present

## 2024-06-19 DIAGNOSIS — M6281 Muscle weakness (generalized): Secondary | ICD-10-CM | POA: Diagnosis not present

## 2024-06-19 DIAGNOSIS — I69920 Aphasia following unspecified cerebrovascular disease: Secondary | ICD-10-CM | POA: Diagnosis not present

## 2024-06-19 DIAGNOSIS — E43 Unspecified severe protein-calorie malnutrition: Secondary | ICD-10-CM | POA: Diagnosis not present

## 2024-06-19 DIAGNOSIS — M6284 Sarcopenia: Secondary | ICD-10-CM | POA: Diagnosis not present

## 2024-06-19 DIAGNOSIS — I10 Essential (primary) hypertension: Secondary | ICD-10-CM | POA: Diagnosis not present

## 2024-06-19 DIAGNOSIS — R64 Cachexia: Secondary | ICD-10-CM | POA: Diagnosis not present

## 2024-06-19 DIAGNOSIS — J449 Chronic obstructive pulmonary disease, unspecified: Secondary | ICD-10-CM | POA: Diagnosis not present

## 2024-06-19 DIAGNOSIS — J45909 Unspecified asthma, uncomplicated: Secondary | ICD-10-CM | POA: Diagnosis not present

## 2024-06-20 DIAGNOSIS — R1310 Dysphagia, unspecified: Secondary | ICD-10-CM | POA: Diagnosis not present

## 2024-06-20 DIAGNOSIS — J45909 Unspecified asthma, uncomplicated: Secondary | ICD-10-CM | POA: Diagnosis not present

## 2024-06-20 DIAGNOSIS — M6284 Sarcopenia: Secondary | ICD-10-CM | POA: Diagnosis not present

## 2024-06-20 DIAGNOSIS — E43 Unspecified severe protein-calorie malnutrition: Secondary | ICD-10-CM | POA: Diagnosis not present

## 2024-06-20 DIAGNOSIS — I10 Essential (primary) hypertension: Secondary | ICD-10-CM | POA: Diagnosis not present

## 2024-06-20 DIAGNOSIS — R279 Unspecified lack of coordination: Secondary | ICD-10-CM | POA: Diagnosis not present

## 2024-06-20 DIAGNOSIS — M4802 Spinal stenosis, cervical region: Secondary | ICD-10-CM | POA: Diagnosis not present

## 2024-06-20 DIAGNOSIS — J449 Chronic obstructive pulmonary disease, unspecified: Secondary | ICD-10-CM | POA: Diagnosis not present

## 2024-06-20 DIAGNOSIS — I69851 Hemiplegia and hemiparesis following other cerebrovascular disease affecting right dominant side: Secondary | ICD-10-CM | POA: Diagnosis not present

## 2024-06-20 DIAGNOSIS — Z8673 Personal history of transient ischemic attack (TIA), and cerebral infarction without residual deficits: Secondary | ICD-10-CM | POA: Diagnosis not present

## 2024-06-20 DIAGNOSIS — G992 Myelopathy in diseases classified elsewhere: Secondary | ICD-10-CM | POA: Diagnosis not present

## 2024-06-20 DIAGNOSIS — R251 Tremor, unspecified: Secondary | ICD-10-CM | POA: Diagnosis not present

## 2024-06-20 DIAGNOSIS — I69920 Aphasia following unspecified cerebrovascular disease: Secondary | ICD-10-CM | POA: Diagnosis not present

## 2024-06-20 DIAGNOSIS — R64 Cachexia: Secondary | ICD-10-CM | POA: Diagnosis not present

## 2024-06-20 DIAGNOSIS — E785 Hyperlipidemia, unspecified: Secondary | ICD-10-CM | POA: Diagnosis not present

## 2024-06-20 DIAGNOSIS — D649 Anemia, unspecified: Secondary | ICD-10-CM | POA: Diagnosis not present

## 2024-06-20 DIAGNOSIS — M6281 Muscle weakness (generalized): Secondary | ICD-10-CM | POA: Diagnosis not present

## 2024-06-23 DIAGNOSIS — M6284 Sarcopenia: Secondary | ICD-10-CM | POA: Diagnosis not present

## 2024-06-23 DIAGNOSIS — I69851 Hemiplegia and hemiparesis following other cerebrovascular disease affecting right dominant side: Secondary | ICD-10-CM | POA: Diagnosis not present

## 2024-06-23 DIAGNOSIS — I10 Essential (primary) hypertension: Secondary | ICD-10-CM | POA: Diagnosis not present

## 2024-06-23 DIAGNOSIS — M4802 Spinal stenosis, cervical region: Secondary | ICD-10-CM | POA: Diagnosis not present

## 2024-06-23 DIAGNOSIS — R251 Tremor, unspecified: Secondary | ICD-10-CM | POA: Diagnosis not present

## 2024-06-23 DIAGNOSIS — R64 Cachexia: Secondary | ICD-10-CM | POA: Diagnosis not present

## 2024-06-23 DIAGNOSIS — Z8673 Personal history of transient ischemic attack (TIA), and cerebral infarction without residual deficits: Secondary | ICD-10-CM | POA: Diagnosis not present

## 2024-06-23 DIAGNOSIS — J45909 Unspecified asthma, uncomplicated: Secondary | ICD-10-CM | POA: Diagnosis not present

## 2024-06-23 DIAGNOSIS — R279 Unspecified lack of coordination: Secondary | ICD-10-CM | POA: Diagnosis not present

## 2024-06-23 DIAGNOSIS — G992 Myelopathy in diseases classified elsewhere: Secondary | ICD-10-CM | POA: Diagnosis not present

## 2024-06-23 DIAGNOSIS — E43 Unspecified severe protein-calorie malnutrition: Secondary | ICD-10-CM | POA: Diagnosis not present

## 2024-06-23 DIAGNOSIS — D649 Anemia, unspecified: Secondary | ICD-10-CM | POA: Diagnosis not present

## 2024-06-23 DIAGNOSIS — J449 Chronic obstructive pulmonary disease, unspecified: Secondary | ICD-10-CM | POA: Diagnosis not present

## 2024-06-23 DIAGNOSIS — I69920 Aphasia following unspecified cerebrovascular disease: Secondary | ICD-10-CM | POA: Diagnosis not present

## 2024-06-23 DIAGNOSIS — M6281 Muscle weakness (generalized): Secondary | ICD-10-CM | POA: Diagnosis not present

## 2024-07-14 DIAGNOSIS — R1310 Dysphagia, unspecified: Secondary | ICD-10-CM | POA: Diagnosis not present

## 2024-07-23 DIAGNOSIS — M6281 Muscle weakness (generalized): Secondary | ICD-10-CM | POA: Diagnosis not present

## 2024-07-23 DIAGNOSIS — I10 Essential (primary) hypertension: Secondary | ICD-10-CM | POA: Diagnosis not present

## 2024-08-12 DIAGNOSIS — R531 Weakness: Secondary | ICD-10-CM | POA: Diagnosis not present

## 2024-08-12 DIAGNOSIS — K551 Chronic vascular disorders of intestine: Secondary | ICD-10-CM | POA: Diagnosis not present

## 2024-08-12 DIAGNOSIS — R64 Cachexia: Secondary | ICD-10-CM | POA: Diagnosis not present

## 2024-08-12 DIAGNOSIS — J4489 Other specified chronic obstructive pulmonary disease: Secondary | ICD-10-CM | POA: Diagnosis not present

## 2024-08-12 DIAGNOSIS — R1084 Generalized abdominal pain: Secondary | ICD-10-CM | POA: Diagnosis not present

## 2024-08-12 DIAGNOSIS — G9341 Metabolic encephalopathy: Secondary | ICD-10-CM | POA: Diagnosis not present

## 2024-08-12 DIAGNOSIS — E43 Unspecified severe protein-calorie malnutrition: Secondary | ICD-10-CM | POA: Diagnosis not present

## 2024-08-12 DIAGNOSIS — R000 Tachycardia, unspecified: Secondary | ICD-10-CM | POA: Diagnosis not present

## 2024-08-12 DIAGNOSIS — K55069 Acute infarction of intestine, part and extent unspecified: Secondary | ICD-10-CM | POA: Diagnosis not present

## 2024-08-12 DIAGNOSIS — E785 Hyperlipidemia, unspecified: Secondary | ICD-10-CM | POA: Diagnosis not present

## 2024-08-12 DIAGNOSIS — G8929 Other chronic pain: Secondary | ICD-10-CM | POA: Diagnosis not present

## 2024-08-12 DIAGNOSIS — R221 Localized swelling, mass and lump, neck: Secondary | ICD-10-CM | POA: Diagnosis not present

## 2024-08-12 DIAGNOSIS — I10 Essential (primary) hypertension: Secondary | ICD-10-CM | POA: Diagnosis not present

## 2024-08-12 DIAGNOSIS — R195 Other fecal abnormalities: Secondary | ICD-10-CM | POA: Diagnosis not present

## 2024-08-12 DIAGNOSIS — R079 Chest pain, unspecified: Secondary | ICD-10-CM | POA: Diagnosis not present

## 2024-08-12 DIAGNOSIS — K5901 Slow transit constipation: Secondary | ICD-10-CM | POA: Diagnosis not present

## 2024-08-12 DIAGNOSIS — F17210 Nicotine dependence, cigarettes, uncomplicated: Secondary | ICD-10-CM | POA: Diagnosis not present

## 2024-08-12 DIAGNOSIS — I69351 Hemiplegia and hemiparesis following cerebral infarction affecting right dominant side: Secondary | ICD-10-CM | POA: Diagnosis not present

## 2024-08-12 DIAGNOSIS — J810 Acute pulmonary edema: Secondary | ICD-10-CM | POA: Diagnosis not present

## 2024-08-12 DIAGNOSIS — Z743 Need for continuous supervision: Secondary | ICD-10-CM | POA: Diagnosis not present

## 2024-08-12 DIAGNOSIS — Z6820 Body mass index (BMI) 20.0-20.9, adult: Secondary | ICD-10-CM | POA: Diagnosis not present

## 2024-08-12 DIAGNOSIS — Z931 Gastrostomy status: Secondary | ICD-10-CM | POA: Diagnosis not present

## 2024-08-19 DIAGNOSIS — G9341 Metabolic encephalopathy: Secondary | ICD-10-CM | POA: Diagnosis not present

## 2024-08-19 DIAGNOSIS — K551 Chronic vascular disorders of intestine: Secondary | ICD-10-CM | POA: Diagnosis not present

## 2024-08-25 DIAGNOSIS — K219 Gastro-esophageal reflux disease without esophagitis: Secondary | ICD-10-CM | POA: Diagnosis not present

## 2024-08-25 DIAGNOSIS — Z931 Gastrostomy status: Secondary | ICD-10-CM | POA: Diagnosis not present
